# Patient Record
Sex: Male | Born: 1995
Health system: Southern US, Community
[De-identification: ages and names within clinical notes are randomized; demographics above are authoritative.]

---

## 2017-01-28 DIAGNOSIS — J309 Allergic rhinitis, unspecified: Secondary | ICD-10-CM | POA: Insufficient documentation

## 2017-06-05 DIAGNOSIS — Z111 Encounter for screening for respiratory tuberculosis: Secondary | ICD-10-CM | POA: Diagnosis not present

## 2017-06-05 DIAGNOSIS — Z23 Encounter for immunization: Secondary | ICD-10-CM | POA: Diagnosis not present

## 2017-06-05 DIAGNOSIS — Z0000 Encounter for general adult medical examination without abnormal findings: Secondary | ICD-10-CM | POA: Diagnosis not present

## 2017-06-05 DIAGNOSIS — Z6834 Body mass index (BMI) 34.0-34.9, adult: Secondary | ICD-10-CM | POA: Diagnosis not present

## 2017-08-05 DIAGNOSIS — E6609 Other obesity due to excess calories: Secondary | ICD-10-CM | POA: Diagnosis not present

## 2017-08-05 DIAGNOSIS — Z6833 Body mass index (BMI) 33.0-33.9, adult: Secondary | ICD-10-CM | POA: Diagnosis not present

## 2019-04-11 ENCOUNTER — Telehealth: Payer: Self-pay

## 2019-04-11 NOTE — Telephone Encounter (Signed)
Questions for Screening COVID-19  Symptom onset:n/a  Travel or Contacts: no  During this illness, did/does the patient experience any of the following symptoms? Fever >100.4F []  Yes [x]  No []  Unknown Subjective fever (felt feverish) []  Yes [x]  No []  Unknown Chills []  Yes [x]  No []  Unknown Muscle aches (myalgia) []  Yes [x]  No []  Unknown Runny nose (rhinorrhea) []  Yes [x]  No []  Unknown Sore throat []  Yes [x]  No []  Unknown Cough (new onset or worsening of chronic cough) []  Yes [x]  No []  Unknown Shortness of breath (dyspnea) []  Yes [x]  No []  Unknown Nausea or vomiting []  Yes [x]  No []  Unknown Headache []  Yes [x]  No []  Unknown Abdominal pain  []  Yes [x]  No []  Unknown Diarrhea (?3 loose/looser than normal stools/24hr period) []  Yes [x]  No []  Unknown Other, specify:  Patient risk factors: Smoker? []  Current []  Former []  Never If male, currently pregnant? []  Yes []  No  There are no active problems to display for this patient.   Plan:  []  High risk for COVID-19 with red flags go to ED (with CP, SOB, weak/lightheaded, or fever > 101.5). Call ahead.  []  High risk for COVID-19 but stable. Inform provider and coordinate time for WEBEX visit.   []  No red flags but URI signs or symptoms okay for WEBEX visit.  

## 2019-04-12 ENCOUNTER — Encounter: Payer: Self-pay | Admitting: Family Medicine

## 2019-04-12 ENCOUNTER — Ambulatory Visit: Payer: BC Managed Care – PPO | Admitting: Family Medicine

## 2019-04-12 VITALS — BP 130/90 | HR 88 | Temp 97.4°F | Ht 75.5 in | Wt 267.6 lb

## 2019-04-12 DIAGNOSIS — G4719 Other hypersomnia: Secondary | ICD-10-CM | POA: Diagnosis not present

## 2019-04-12 DIAGNOSIS — R5383 Other fatigue: Secondary | ICD-10-CM | POA: Diagnosis not present

## 2019-04-12 LAB — CBC WITH DIFFERENTIAL/PLATELET
Basophils Absolute: 0.1 10*3/uL (ref 0.0–0.1)
Basophils Relative: 1.4 % (ref 0.0–3.0)
Eosinophils Absolute: 0.2 10*3/uL (ref 0.0–0.7)
Eosinophils Relative: 5.4 % — ABNORMAL HIGH (ref 0.0–5.0)
HCT: 42.5 % (ref 39.0–52.0)
Hemoglobin: 14 g/dL (ref 13.0–17.0)
Lymphocytes Relative: 31.9 % (ref 12.0–46.0)
Lymphs Abs: 1.3 10*3/uL (ref 0.7–4.0)
MCHC: 32.8 g/dL (ref 30.0–36.0)
MCV: 84.3 fl (ref 78.0–100.0)
Monocytes Absolute: 0.3 10*3/uL (ref 0.1–1.0)
Monocytes Relative: 6.3 % (ref 3.0–12.0)
Neutro Abs: 2.2 10*3/uL (ref 1.4–7.7)
Neutrophils Relative %: 55 % (ref 43.0–77.0)
Platelets: 289 10*3/uL (ref 150.0–400.0)
RBC: 5.05 Mil/uL (ref 4.22–5.81)
RDW: 14 % (ref 11.5–15.5)
WBC: 4 10*3/uL (ref 4.0–10.5)

## 2019-04-12 LAB — TSH: TSH: 0.86 u[IU]/mL (ref 0.35–4.50)

## 2019-04-12 LAB — COMPREHENSIVE METABOLIC PANEL
ALT: 29 U/L (ref 0–53)
AST: 22 U/L (ref 0–37)
Albumin: 4.4 g/dL (ref 3.5–5.2)
Alkaline Phosphatase: 74 U/L (ref 39–117)
BUN: 12 mg/dL (ref 6–23)
CO2: 29 mEq/L (ref 19–32)
Calcium: 9.8 mg/dL (ref 8.4–10.5)
Chloride: 103 mEq/L (ref 96–112)
Creatinine, Ser: 1.21 mg/dL (ref 0.40–1.50)
GFR: 88.99 mL/min (ref >60.00)
Glucose, Bld: 72 mg/dL (ref 70–99)
Potassium: 3.9 mEq/L (ref 3.5–5.1)
Sodium: 139 mEq/L (ref 135–145)
Total Bilirubin: 0.3 mg/dL (ref 0.2–1.2)
Total Protein: 7 g/dL (ref 6.0–8.3)

## 2019-04-12 LAB — VITAMIN B12: Vitamin B-12: 250 pg/mL (ref 211–911)

## 2019-04-12 NOTE — Patient Instructions (Signed)
Great to meet you! We'll be in touch with lab results.  If labs are normal I think we should refer you for a sleep study.

## 2019-04-12 NOTE — Assessment & Plan Note (Signed)
Check baseline labs today including: Orders Placed This Encounter  Procedures  . Comp Met (CMET)  . CBC w/Diff  . TSH  . B12  Discussed if labs are normal I would recommend referral for sleep study.  Advised caution with driving and to only drive if well rested.
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
## 2019-04-14 NOTE — Progress Notes (Signed)
Labs are normal.  Nothing on lab testing to explain fatigue.  Would recommend proceeding with sleep evaluation.

## 2019-04-20 ENCOUNTER — Other Ambulatory Visit: Payer: Self-pay | Admitting: Family Medicine

## 2019-04-20 DIAGNOSIS — R400 Somnolence: Secondary | ICD-10-CM

## 2019-04-20 NOTE — Progress Notes (Signed)
Refer sleep

## 2019-04-27 ENCOUNTER — Ambulatory Visit: Payer: BC Managed Care – PPO | Admitting: Neurology

## 2019-04-27 ENCOUNTER — Encounter: Payer: Self-pay | Admitting: Neurology

## 2019-04-27 ENCOUNTER — Other Ambulatory Visit: Payer: Self-pay

## 2019-04-27 VITALS — BP 131/82 | HR 79 | Ht 75.5 in | Wt 267.0 lb

## 2019-04-27 DIAGNOSIS — E669 Obesity, unspecified: Secondary | ICD-10-CM

## 2019-04-27 DIAGNOSIS — Z820 Family history of epilepsy and other diseases of the nervous system: Secondary | ICD-10-CM | POA: Diagnosis not present

## 2019-04-27 DIAGNOSIS — R6889 Other general symptoms and signs: Secondary | ICD-10-CM

## 2019-04-27 DIAGNOSIS — R0683 Snoring: Secondary | ICD-10-CM

## 2019-04-27 DIAGNOSIS — G4710 Hypersomnia, unspecified: Secondary | ICD-10-CM

## 2019-04-27 DIAGNOSIS — R400 Somnolence: Secondary | ICD-10-CM

## 2019-04-27 NOTE — Patient Instructions (Signed)
Thank you for choosing [facility] for your sleep related care! It was nice to meet you today! I appreciate that you entrust me with your sleep related healthcare concerns. I hope, I was able to address at least some of your concerns today, and that I can help you feel reassured and also get better.    Here is what we discussed today and what we came up with as our plan for you:    Based on your symptoms and your exam I believe you may be at some risk for obstructive sleep apnea or OSA, and I think we should check your sleep for sleep apnea. We will look into your severe sleepiness with a nighttime sleep study, followed by a daytime nap study. In preparation for sleep study testing, you cannot be on or start any medications in the group of antidepressants, antianxiety medications, narcotic pain medications. Please do not drive when sleepy! Please keep your sleep schedule stable and do not add any additional medications or caffeine in your day to day routine, in preparation of the study.   If you have obstructive sleep apnea, we will forego daytime sleep testing and I will recommend treatment for sleep apnea with a CPAP or AutoPap machine.  Our sleep lab administrative assistant will call you to schedule your sleep study. If you don't hear back from her by about 2 weeks from now, please feel free to call her at [phone]. You can leave a message with your phone number and concerns, if you get the voicemail box. She will call back as soon as possible.
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
## 2019-05-23 ENCOUNTER — Ambulatory Visit (INDEPENDENT_AMBULATORY_CARE_PROVIDER_SITE_OTHER): Payer: BC Managed Care – PPO | Admitting: Neurology

## 2019-05-23 DIAGNOSIS — G4720 Circadian rhythm sleep disorder, unspecified type: Secondary | ICD-10-CM

## 2019-05-23 DIAGNOSIS — Z820 Family history of epilepsy and other diseases of the nervous system: Secondary | ICD-10-CM

## 2019-05-23 DIAGNOSIS — E669 Obesity, unspecified: Secondary | ICD-10-CM

## 2019-05-23 DIAGNOSIS — R0683 Snoring: Secondary | ICD-10-CM

## 2019-05-23 DIAGNOSIS — R6889 Other general symptoms and signs: Secondary | ICD-10-CM

## 2019-05-23 DIAGNOSIS — R400 Somnolence: Secondary | ICD-10-CM

## 2019-05-23 DIAGNOSIS — G4710 Hypersomnia, unspecified: Secondary | ICD-10-CM | POA: Diagnosis not present

## 2019-05-24 ENCOUNTER — Ambulatory Visit (INDEPENDENT_AMBULATORY_CARE_PROVIDER_SITE_OTHER): Payer: BC Managed Care – PPO | Admitting: Neurology

## 2019-05-24 ENCOUNTER — Other Ambulatory Visit: Payer: Self-pay

## 2019-05-24 DIAGNOSIS — R400 Somnolence: Secondary | ICD-10-CM

## 2019-05-24 DIAGNOSIS — G4710 Hypersomnia, unspecified: Secondary | ICD-10-CM

## 2019-05-24 DIAGNOSIS — Z820 Family history of epilepsy and other diseases of the nervous system: Secondary | ICD-10-CM | POA: Diagnosis not present

## 2019-05-24 DIAGNOSIS — G47419 Narcolepsy without cataplexy: Secondary | ICD-10-CM

## 2019-05-24 DIAGNOSIS — R6889 Other general symptoms and signs: Secondary | ICD-10-CM | POA: Diagnosis not present

## 2019-05-24 DIAGNOSIS — R0683 Snoring: Secondary | ICD-10-CM

## 2019-05-24 DIAGNOSIS — E669 Obesity, unspecified: Secondary | ICD-10-CM

## 2019-05-24 NOTE — Addendum Note (Signed)
Addended by: Lester Fairford A on: 05/24/2019 02:38 PM   Modules accepted: Orders

## 2019-05-24 NOTE — Addendum Note (Signed)
Addended by: Inis Sizer D on: 05/24/2019 04:01 PM   Modules accepted: Orders

## 2019-05-27 LAB — COMPREHENSIVE DRUG ANALYSIS,UR

## 2019-06-01 ENCOUNTER — Telehealth: Payer: Self-pay

## 2019-06-01 NOTE — Progress Notes (Signed)
See result note for NPSG.
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
## 2019-06-01 NOTE — Telephone Encounter (Signed)
-----   Message from Star Age, MD sent at 06/01/2019  8:40 AM EDT ----- Patient referred by Dr. Zigmund Daniel, seen by me on 04/27/19, diagnostic PSG on 9/14, MSLT on 05/24/19.   Please call and notify the patient that the recent sleep study and next day nap study did show mild, more nonspecific degree of sleepiness, not clearly supportive of narcolepsy or idiopathic hypersomnolence, although he did fall asleep in all 5 naps. I would like to go over the details of the study during a follow up appointment. Pls arrange a followup appointment at his next convenience. I would like to consider a blood test for narcolepsy marker as well.  Thanks,  Star Age, MD, PhD [facility])

## 2019-06-01 NOTE — Telephone Encounter (Signed)
I called pt. 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

## 2019-06-01 NOTE — Procedures (Signed)
PATIENT'S NAME:  Sean [name], Sean [name] DOB:      Aug 17, 1996      MR#:    [id number]     DATE OF RECORDING: 05/23/2019 REFERRING M.D.:  Luetta Nutting, DO Study Performed:   Baseline Polysomnogram HISTORY: 23 year old man with a history of seasonal allergies, childhood asthma, cigar smoking and mild obesity, who reports some snoring and significant daytime somnolence to the point where he falls asleep inadvertently and has fallen asleep at the wheel even. He has been sleepy for years. The patient endorsed the Epworth Sleepiness Scale at 14 points. The patient's weight 267 pounds with a height of 75 (inches), resulting in a BMI of 33.2 kg/m2. The patient's neck circumference measured 18.25 inches. He presents for a nocturnal PSG with next day MSLT. He had a UDS on 05/24/19, which was negative.   CURRENT MEDICATIONS: None reported.    PROCEDURE:  This is a multichannel digital polysomnogram utilizing the Somnostar 11.2 system.  Electrodes and sensors were applied and monitored per AASM Specifications.   EEG, EOG, Chin and Limb EMG, were sampled at 200 Hz.  ECG, Snore and Nasal Pressure, Thermal Airflow, Respiratory Effort, CPAP Flow and Pressure, Oximetry was sampled at 50 Hz. Digital video and audio were recorded.      BASELINE STUDY  Lights Out was at 21:58 and Lights On at 05:45.  Total recording time (TRT) was 467.5 minutes, with a total sleep time (TST) of 390 minutes.   The patient's sleep latency was 33 minutes, which is mildly delayed.  REM latency was 69 minutes, which is low normal. The sleep efficiency was 83.4 %.     SLEEP ARCHITECTURE: WASO (Wake after sleep onset) was 50 minutes with mild to moderate sleep fragmentation noted. There were 50.5 minutes in Stage N1, 150 minutes Stage N2, 112.5 minutes Stage N3 and 77 minutes in Stage REM.  The percentage of Stage N1 was 12.9%, which is increased, Stage N2 was 38.5%, Stage N3 was 28.8%, which is mildly increased, and Stage R (REM sleep) was 19.7%, which  is normal. The arousals were noted as: 57 were spontaneous, 0 were associated with PLMs, 0 were associated with respiratory events.  RESPIRATORY ANALYSIS:  There were a total of 3 respiratory events:  0 obstructive apneas, 3 central apneas and 0 mixed apneas with a total of 3 apneas and an apnea index (AI) of .5 /hour. There were 0 hypopneas with a hypopnea index of 0 /hour. The patient also had 0 respiratory event related arousals (RERAs).      The total APNEA/HYPOPNEA INDEX (AHI) was .5 /hour and the total RESPIRATORY DISTURBANCE INDEX was .5 /hour.  3 events occurred in REM sleep and 0 events in NREM. The REM AHI was 2.3 /hour, versus a non-REM AHI of 0. The patient spent 2.5 minutes of total sleep time in the supine position and 388 minutes in non-supine.. The supine AHI was 0.0 versus a non-supine AHI of 0.5.  OXYGEN SATURATION & C02:  The Wake baseline 02 saturation was 96%, with the lowest being 87%. Time spent below 89% saturation equaled 0 minutes. PERIODIC LIMB MOVEMENTS: The patient had a total of 0 Periodic Limb Movements.  The Periodic Limb Movement (PLM) index was 0 and the PLM Arousal index was 0/hour.  Audio and video analysis did not show any abnormal or unusual movements, behaviors, phonations or vocalizations. The patient took no bathroom breaks. Mild to moderate (at times) snoring was noted. The EKG was in keeping with normal sinus  rhythm (NSR).  Post-study, the patient indicated that sleep was the same as usual.   The patient had a nap study, MSLT, next day on 05/24/19 with a mean sleep latency of 13.5 minutes for 5 naps and 0 SOREMP (sleep onset REM periods) noted. He did achieve sleep in all 5 naps.  IMPRESSION:  1. Hypersomnia NOS 2. Primary Snoring 3. Dysfunctions associated with sleep stages or arousal from sleep  RECOMMENDATIONS:  1. The overnight sleep study does not demonstrate any significant obstructive or central sleep disordered breathing. No significant PLMs  were noted. No significant desaturations noted, with the exception of one brief desaturation to 87%. Mild to moderate snoring was noted.  2. The night time sleep study shows sleep fragmentation and abnormal sleep stage percentages; these are nonspecific findings and per se do not signify an intrinsic sleep disorder or a cause for the patient's sleep-related symptoms. Causes include (but are not limited to) the first night effect of the sleep study, circadian rhythm disturbances, medication effect or an underlying mood disorder or medical problem.  3. The overnight sleep study and next day MSLT/nap study demonstrate a normal percentage of REM sleep and low normal REM latency for the overnight study and a very mildly low mean sleep latency (13.5 min) and no SOREMPs (sleep onset REM periods) for the nap study. These findings are mildly abnormal and do not fulfill diagnostic criteria for narcolepsy of up to 8 min for the mean sleep latency and 2 or more SOREMPs, or idiopathic hypersomnolence. Clinical correlation is required.  4. The patient should be cautioned not to drive, work at heights, or operate dangerous or heavy equipment when tired or sleepy. Review and reiteration of good sleep hygiene measures should be pursued with any patient. 5. The patient will be seen in follow-up in the sleep clinic at [facility] for discussion of the test results, symptom, management strategies, etc. The referring provider will be notified of the test results.  I certify that I have reviewed the entire raw data recording prior to the issuance of this report in accordance with the Standards of Accreditation of the American Academy of Sleep Medicine (AASM)  Huston Foley, MD, PhD Diplomat, American Board of Neurology and Sleep Medicine (Neurology and Sleep Medicine)

## 2019-06-01 NOTE — Progress Notes (Signed)
Patient referred by Dr. Zigmund Daniel, seen by me on 04/27/19, diagnostic PSG on 9/14, MSLT on 05/24/19.   Please call and notify the patient that the recent sleep study and next day nap study did show mild, more nonspecific degree of sleepiness, not clearly supportive of narcolepsy or idiopathic hypersomnolence, although he did fall asleep in all 5 naps. I would like to go over the details of the study during a follow up appointment. Pls arrange a followup appointment at his next convenience. I would like to consider a blood test for narcolepsy marker as well.  Thanks,  Star Age, MD, PhD [facility])

## 2019-06-01 NOTE — Telephone Encounter (Signed)
I called pt and discussed his sleep study results and recommendations. Pt is agreeable to a follow up appt on 06/22/2019 at 2:00pm. Pt verbalized understanding of results. Pt had no questions at this time but was encouraged to call back if questions arise.

## 2019-06-22 ENCOUNTER — Telehealth: Payer: Self-pay

## 2019-06-22 ENCOUNTER — Ambulatory Visit: Payer: Self-pay | Admitting: Neurology

## 2019-06-22 NOTE — Telephone Encounter (Signed)
Pt did not show for their appt with Dr. Athar today.  

## 2019-06-23 ENCOUNTER — Encounter: Payer: Self-pay | Admitting: Neurology

## 2019-06-29 ENCOUNTER — Other Ambulatory Visit: Payer: Self-pay

## 2019-06-29 ENCOUNTER — Encounter: Payer: Self-pay | Admitting: Neurology

## 2019-06-29 ENCOUNTER — Ambulatory Visit (INDEPENDENT_AMBULATORY_CARE_PROVIDER_SITE_OTHER): Payer: BC Managed Care – PPO | Admitting: Neurology

## 2019-06-29 VITALS — BP 150/100 | HR 74 | Temp 98.0°F | Ht 75.5 in | Wt 278.0 lb

## 2019-06-29 DIAGNOSIS — G4710 Hypersomnia, unspecified: Secondary | ICD-10-CM | POA: Diagnosis not present

## 2019-06-29 NOTE — Patient Instructions (Signed)
Your B12 level was on the lower end of the spectrum in August, you also had a thyroid screening test done called TSH which was normal at the time.  You have no history of anemia.  I would recommend following up with primary care physician, he may want to check additional labs such as repeat B12 level, vitamin D, and testosterone level.  I do not think we need to do labs here today. If you would like to consider Provigil for as needed use or even daily use to help you stay more awake and alert during the day for nonspecific daytime sleepiness, I would be happy to prescribe it. Again, your recent sleep study at night did not show any significant sleep apnea or significant or sustained desaturations.  Your daytime sleepiness was Notable during your daytime nap study, but not in keeping with a diagnosis of narcolepsy and does not support diagnostic criteria for what we call idiopathic hypersomnolence.   For now, as discussed, I would be happy to see you back as needed. Please do not drive when you feel sleepy.

## 2019-06-29 NOTE — Progress Notes (Signed)
Subjective:    [name] ID: Sean [name] is a 23 y.o. male.  HPI     Interim history:   Sean [name] is a 23 year old right-handed gentleman with an underlying medical history of seasonal allergies, childhood asthma, cigar smoking and mild obesity, who presents for follow-up consultation of his excessive daytime somnolence after interim sleep study testing.  Sean [name] is accompanied by his wife today.  He missed an appointment on 06/22/2019.  I first met him on 04/27/2019 at Sean request of his primary care physician, at which time he reported excessive daytime somnolence since high school days.  He used to fall asleep in class.  He had fallen asleep at Sean wheel even.  He did not endorse any telltale symptoms of cataplexy or hypnagogic or hypnopompic hallucinations but reported vivid dreams.  He had rare sleep paralysis episodes.  He was advised to proceed with extended sleep testing in Sean form of nocturnal polysomnogram with next day nap testing.  He had testing with a baseline polysomnogram on 05/23/2019 followed by an MSLT on 05/24/2019. I went over his test results with them in detail today.  His baseline sleep study showed a sleep latency of 33 minutes, sleep efficiency of 83.4%, REM latency was 69 minutes.  He had an increased percentage of stage I sleep and a mildly increased percentage of slow-wave sleep, REM sleep was normal at 19.7%.  His AHI was 0.5/h which is normal, average oxygen saturation was 96%, nadir was 87%, time below 89% saturation was 0 minutes, he had no significant PLM's.  He had some snoring, it was in Sean mild to moderate range.  His next day MSLT showed a mean sleep latency of 13.5 minutes for 5 naps with 0 sleep onset REM periods.  This indicated overall mild daytime somnolence.  Findings were not supportive of narcolepsy.  Diagnostic criteria for idiopathic hypersomnolence were also not fulfilled because of his mean sleep latency of over 8 minutes.  Today, 06/29/2019: He  reports no new symptoms, feels like on a day-to-day basis he is not as sleepy but there are days where he feels very sleepy.  No particular pattern no rhyme or reason.  His wife reports that he does not even have to be on a long drive to get sleepy at Sean wheel, it can happen on a shorter drive of 10 to 30 minutes even.  He is working from home but has also had to go in for work.  He is utilizing some caffeine to help him wake up early.  He is without any new symptoms but his blood pressure is a little high today and we rechecked it.   Sean [name]'s allergies, current medications, family history, past medical history, past social history, past surgical history and problem list were reviewed and updated as appropriate.    Previously:   04/27/2019: (He) reports some snoring and significant daytime somnolence to Sean point where he falls asleep inadvertently and has fallen asleep at Sean wheel even.  He has been sleepy since high school days.  He has fallen asleep in class in Sean past.  He denies any telltale symptoms of cataplexy or hypnagogic or hypnopompic hallucinations, he has vivid dreams, he has had sleep paralysis at least once.  He has also had dreams during shorter naps.   I reviewed your office note from 04/12/2019.  His Epworth sleepiness score is 14 out of 24, fatigue severity score is 25 out of 63.  He admits that he recently dozed  off at Sean wheel.  He did not have a car accident thankfully.  He typically has a 20-minute commute to work.  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  He is married and lives with his wife who is a Marine scientist and wants him to get checked out for his sleepiness.  He is a restless sleeper at night.  He has some snoring.  Father has sleep apnea and uses a CPAP machine, no family history of narcolepsy.  They have 2 dogs in Sean household, typically in their bedroom but not on Sean bed with them, no kids, TV is on a timer at night in Sean bedroom.  He does not have night to night  nocturia or recurrent morning headaches.  He is a non-smoker of cigarettes but occasionally smokes a cigar.  He drinks alcohol occasionally, maybe once a week, caffeine in Sean form of coffee, 16 ounce per day and soda maybe 8 ounce per day.  His Past Medical History Is Significant For: History reviewed. No pertinent past medical history.  His Past Surgical History Is Significant For: History reviewed. No pertinent surgical history.  His Family History Is Significant For: Family History  Problem Relation Age of Onset  . Diabetes Father   . Diabetes Maternal Grandfather     His Social History Is Significant For: Social History   Socioeconomic History  . Marital status: Married    Spouse name: Not on file  . Number of children: Not on file  . Years of education: Not on file  . Highest education level: Not on file  Occupational History  . Not on file  Social Needs  . Financial resource strain: Not on file  . Food insecurity    Worry: Not on file    Inability: Not on file  . Transportation needs    Medical: Not on file    Non-medical: Not on file  Tobacco Use  . Smoking status: Current Some Day Smoker    Types: Cigars  . Smokeless tobacco: Never Used  Substance and Sexual Activity  . Alcohol use: Never    Frequency: Never  . Drug use: Never  . Sexual activity: Not on file  Lifestyle  . Physical activity    Days per week: Not on file    Minutes per session: Not on file  . Stress: Not on file  Relationships  . Social Herbalist on phone: Not on file    Gets together: Not on file    Attends religious service: Not on file    Active member of club or organization: Not on file    Attends meetings of clubs or organizations: Not on file    Relationship status: Not on file  Other Topics Concern  . Not on file  Social History Narrative  . Not on file    His Allergies Are:  No Known Allergies:   His Current Medications Are:  No outpatient encounter  medications on file as of 06/29/2019.   No facility-administered encounter medications on file as of 06/29/2019.   :  Review of Systems:  Out of a complete 14 point review of systems, all are reviewed and negative with Sean exception of these symptoms as listed below: Review of Systems  Neurological:       Pt presents today to discuss his sleep study results and treatment recommendations.    Objective:  Neurological Exam  Physical Exam Physical Examination:   Vitals:   06/29/19 1258  06/29/19 1259  BP: (!) 144/100 (!) 146/98  Pulse: 71 74  Temp: 98 F (36.7 C)    Repeat blood pressure testing at Sean end of Sean visit manually was 150/100.  He has no complaints of chest pain or shortness of breath or Headache.  General Examination: Sean [name] is a very pleasant 23 y.o. male in no acute distress. He appears well-developed and well-nourished and well groomed.   HEENT: Normocephalic, atraumatic, pupils are equal, round and reactive to light, Extraocular tracking is preserved, facial animation is normal, speech is clear, hearing grossly intact, airway examination revealed stable findings with mild mouth dryness and mild airway crowding, tongue protrudes centrally and palate elevates symmetrically.    Chest: Clear to auscultation without wheezing, rhonchi or crackles noted.  Heart: S1+S2+0, regular and normal without murmurs, rubs or gallops noted.   Abdomen: Soft, non-tender and non-distended with normal bowel sounds appreciated on auscultation.  Extremities: There is no pitting edema in Sean distal lower extremities bilaterally.   Skin: Warm and dry without trophic changes noted.  Musculoskeletal: exam reveals no obvious joint deformities, tenderness or joint swelling or erythema.   Neurologically:  Mental status: Sean [name] is awake, alert and oriented in all 4 spheres. His immediate and remote memory, attention, language skills and fund of knowledge are appropriate.  There is no evidence of aphasia, agnosia, apraxia or anomia. Speech is clear with normal prosody and enunciation. Thought process is linear. Mood is normal and affect is normal.  Cranial nerves II - XII are as described above under HEENT exam.  Motor exam: Normal bulk, strength and tone is noted. There is no  Tremor. Romberg is negative. Fine motor skills and coordination: grossly intact.  Cerebellar testing: No dysmetria or intention tremor. There is no truncal or gait ataxia.  Sensory exam: intact to light touch in Sean upper and lower extremities.  Gait, station and balance: He stands easily. No veering to one side is noted. No leaning to one side is noted. Posture is age-appropriate and stance is narrow based. Gait shows normal stride length and normal pace. No problems turning are noted.   Assessment and Plan:  In summary, Kynan Peasley is a very pleasant 23 year old male with an underlying medical history of seasonal allergies, childhood asthma, cigar smoking and mild obesity, who Presents for Follow-up consultation of his excessive daytime somnolence, sleepiness dates back to even high school days.  He had a interim sleep study testing in Sean form of a diagnostic polysomnogram, followed by a next day nap study.  He has no underlying obstructive sleep disordered breathing.  He had no significant PLM's during Sean nighttime sleep study.  He had some mild to moderate snoring.  Nap testing showed a mild degree of daytime somnolence with a mean sleep latency of 13.5 minutes, no sleep onset REM periods were noted.  During Sean nighttime sleep study, his REM latency was on Sean low normal side, REM percentage was low normal at 19.7%.  His sleep test results do not fulfill criteria for narcolepsy which requires less than 8 minutes for mean sleep latency and at least 2 REM onset periods during nap testing.  He does appear to have a more nonspecific degree of hypersomnolence, he may benefit from trying a  nonstimulant type wake promoting agent, such as Provigil or Nuvigil.  I talked to Sean [name] and his wife at length today.  I would not necessarily feel comfortable to prescribe a stimulant, especially since stimulants are controlled  substances and can cause side effects and also in light of his elevated blood pressure values, repeat check at Sean end of Sean visit showed a similar blood pressure, he had no symptoms today.  He is advised to follow-up with his primary care physician.  He had some blood work which I reviewed.  He did not have any B12 deficiency but B12 was on Sean lower end of Sean spectrum, TSH was normal recently. His wife is reluctant and voices concern regarding using Provigil.  He is at this juncture advised to follow-up with his PCP.  I would be happy to see him as needed.  If he changes his mind regarding trying Provigil I would be happy to prescribe a low dose and have him try it. We talked a little bit about narcolepsy HLA testing which is a marker for narcolepsy but not a definitive test.  He declines this blood work, I also explained to him that it may or may not really change our management at this time. I answered all their questions today and Sean [name] and his wife were in agreement.

## 2019-07-14 ENCOUNTER — Other Ambulatory Visit: Payer: Self-pay

## 2019-07-14 DIAGNOSIS — Z20822 Contact with and (suspected) exposure to covid-19: Secondary | ICD-10-CM

## 2019-07-15 LAB — NOVEL CORONAVIRUS, NAA: SARS-CoV-2, NAA: NOT DETECTED

## 2020-04-04 ENCOUNTER — Telehealth: Payer: Self-pay | Admitting: General Practice

## 2020-04-04 NOTE — Telephone Encounter (Signed)
Patient called after hours service to schedule TOC due to Dr. Ashley Royalty leaving [facility]. I called the patient and left message to call the office to schedule.

## 2020-05-02 ENCOUNTER — Other Ambulatory Visit: Payer: Self-pay

## 2020-05-03 ENCOUNTER — Ambulatory Visit: Payer: BC Managed Care – PPO | Admitting: Family Medicine

## 2020-05-03 ENCOUNTER — Encounter: Payer: Self-pay | Admitting: Family Medicine

## 2020-05-03 ENCOUNTER — Ambulatory Visit (INDEPENDENT_AMBULATORY_CARE_PROVIDER_SITE_OTHER): Payer: BC Managed Care – PPO

## 2020-05-03 VITALS — BP 122/78 | HR 88 | Temp 96.7°F | Ht 75.0 in | Wt 279.0 lb

## 2020-05-03 DIAGNOSIS — M25561 Pain in right knee: Secondary | ICD-10-CM

## 2020-05-03 MED ORDER — MELOXICAM 7.5 MG PO TABS: 7.5000 mg | ORAL_TABLET | Freq: Every day | ORAL | 0 refills | Status: DC

## 2020-05-03 NOTE — Progress Notes (Signed)
Established Patient Office Visit  Subjective:  Patient ID: Sean [name], male    DOB: Jan 04, 1996  Age: 24 y.o. MRN: [id number]  CC:  Chief Complaint  Patient presents with  . Knee Pain    Knee pain that come and go x 2 months C/O some swelling sometimes.     HPI [place] presents for evaluation of a 3 to 55-month history of right knee pain.  Denies injury locking or giving way.  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  He is he is on his feet throughout the day.  Athletically active on the weekends with occasional pickup basketball games.  Most of the pain is in the posterior aspect of the knee.  There seems to be swelling there.  History reviewed. No pertinent past medical history.  History reviewed. No pertinent surgical history.  Family History  Problem Relation Age of Onset  . Diabetes Father   . Diabetes Maternal Grandfather     Social History   Socioeconomic History  . Marital status: Married    Spouse name: Not on file  . Number of children: Not on file  . Years of education: Not on file  . Highest education level: Not on file  Occupational History  . Not on file  Tobacco Use  . Smoking status: Current Some Day Smoker    Types: Cigars  . Smokeless tobacco: Never Used  Substance and Sexual Activity  . Alcohol use: Never  . Drug use: Never  . Sexual activity: Not on file  Other Topics Concern  . Not on file  Social History Narrative  . Not on file   Social Determinants of Health   Financial Resource Strain:   . Difficulty of Paying Living Expenses: Not on file  Food Insecurity:   . Worried About Programme researcher, broadcasting/film/video in the Last Year: Not on file  . Ran Out of Food in the Last Year: Not on file  Transportation Needs:   . Lack of Transportation (Medical): Not on file  . Lack of Transportation (Non-Medical): Not on file  Physical Activity:   . Days of Exercise per Week: Not on file    . Minutes of Exercise per Session: Not on file  Stress:   . Feeling of Stress : Not on file  Social Connections:   . Frequency of Communication with Friends and Family: Not on file  . Frequency of Social Gatherings with Friends and Family: Not on file  . Attends Religious Services: Not on file  . Active Member of Clubs or Organizations: Not on file  . Attends Banker Meetings: Not on file  . Marital Status: Not on file  Intimate Partner Violence:   . Fear of Current or Ex-Partner: Not on file  . Emotionally Abused: Not on file  . Physically Abused: Not on file  . Sexually Abused: Not on file    No outpatient medications prior to visit.   No facility-administered medications prior to visit.    No Known Allergies  ROS Review of Systems  Constitutional: Negative.   HENT: Negative.   Respiratory: Negative.   Cardiovascular: Negative.   Gastrointestinal: Negative.   Genitourinary: Negative.   Musculoskeletal: Positive for arthralgias. Negative for gait problem.  Psychiatric/Behavioral: Negative.       Objective:    Physical Exam Vitals and nursing note reviewed.  Constitutional:      General: He is not in acute  distress.    Appearance: Normal appearance. He is not ill-appearing or toxic-appearing.  HENT:     Right Ear: External ear normal.     Left Ear: External ear normal.  Eyes:     General:        Right eye: No discharge.        Left eye: No discharge.     Conjunctiva/sclera: Conjunctivae normal.  Musculoskeletal:     Right knee: Swelling present. No effusion, erythema or bony tenderness. No tenderness. Normal patellar mobility.     Instability Tests: Anterior drawer test negative.     Left knee: No swelling, effusion, erythema or bony tenderness. No tenderness. Normal patellar mobility.     Right lower leg: No edema.     Left lower leg: No edema.       Legs:  Neurological:     Mental Status: He is alert and oriented to person, place, and  time.  Psychiatric:        Mood and Affect: Mood normal.        Behavior: Behavior normal.     BP 122/78   Pulse 88   Temp (!) 96.7 F (35.9 C) (Tympanic)   Ht 6\' 3"  (1.905 m)   Wt 279 lb (126.6 kg)   SpO2 97%   BMI 34.87 kg/m  Wt Readings from Last 3 Encounters:  05/03/20 279 lb (126.6 kg)  06/29/19 278 lb (126.1 kg)  04/27/19 267 lb (121.1 kg)     Health Maintenance Due  Topic Date Due  . Hepatitis C Screening  Never done  . HIV Screening  Never done  . INFLUENZA VACCINE  04/08/2020    There are no preventive care reminders to display for this patient.  Lab Results  Component Value Date   TSH 0.86 04/12/2019   Lab Results  Component Value Date   WBC 4.0 04/12/2019   HGB 14.0 04/12/2019   HCT 42.5 04/12/2019   MCV 84.3 04/12/2019   PLT 289.0 04/12/2019   Lab Results  Component Value Date   NA 139 04/12/2019   K 3.9 04/12/2019   CO2 29 04/12/2019   GLUCOSE 72 04/12/2019   BUN 12 04/12/2019   CREATININE 1.21 04/12/2019   BILITOT 0.3 04/12/2019   ALKPHOS 74 04/12/2019   AST 22 04/12/2019   ALT 29 04/12/2019   PROT 7.0 04/12/2019   ALBUMIN 4.4 04/12/2019   CALCIUM 9.8 04/12/2019   GFR 88.99 04/12/2019   No results found for: CHOL No results found for: HDL No results found for: LDLCALC No results found for: TRIG No results found for: CHOLHDL No results found for: 06/12/2019    Assessment & Plan:   Problem List Items Addressed This Visit      Other   Right knee pain - Primary   Relevant Medications   meloxicam (MOBIC) 7.5 MG tablet   Other Relevant Orders   DG Knee Complete 4 Views Right   Ambulatory referral to Sports Medicine      Meds ordered this encounter  Medications  . meloxicam (MOBIC) 7.5 MG tablet    Sig: Take 1 tablet (7.5 mg total) by mouth daily.    Dispense:  30 tablet    Refill:  0    Follow-up: Return if symptoms worsen or fail to improve.   Information was given on Baker's cyst.  Trial of meloxicam and low-dose for  30 days.  Follow-up with sports medicine. [id number], MD

## 2020-05-03 NOTE — Patient Instructions (Signed)
Baker Cyst ° °A Baker cyst, also called a popliteal cyst, is a growth that forms at the back of the knee. The cyst forms when the fluid-filled sac (bursa) that cushions the knee joint becomes enlarged. °What are the causes? °In most cases, a Baker cyst results from another knee problem that causes swelling inside the knee. This makes the fluid inside the knee joint (synovial fluid) flow into the bursa behind the knee, causing the bursa to enlarge. °What increases the risk? °You may be more likely to develop a Baker cyst if you already have a knee problem, such as: °· A tear in cartilage that cushions the knee joint (meniscal tear). °· A tear in the tissues that connect the bones of the knee joint (ligament tear). °· Knee swelling from osteoarthritis, rheumatoid arthritis, or gout. °What are the signs or symptoms? °The main symptom of this condition is a lump behind the knee. This may be the only symptom of the condition. The lump may be painful, especially when the knee is straightened. If the lump is painful, the pain may come and go. The knee may also be stiff. °Symptoms may quickly get more severe if the cyst breaks open (ruptures). If the cyst ruptures, you may feel the following in your knee and calf: °· Sudden or worsening pain. °· Swelling. °· Bruising. °· Redness in the calf. °A Baker cyst does not always cause symptoms. °How is this diagnosed? °This condition may be diagnosed based on your symptoms and medical history. Your health care provider will also do a physical exam. This may include: °· Feeling the cyst to check whether it is tender. °· Checking your knee for signs of another knee condition that causes swelling. °You may have imaging tests, such as: °· X-rays. °· MRI. °· Ultrasound. °How is this treated? °A Baker cyst that is not painful may go away without treatment. If the cyst gets large or painful, it will likely get better if the underlying knee problem is treated. °If needed, treatment for a  Baker cyst may include: °· Resting. °· Keeping weight off of the knee. This means not leaning on the knee to support your body weight. °· Taking NSAIDs, such as ibuprofen, to reduce pain and swelling. °· Having a procedure to drain the fluid from the cyst with a needle (aspiration). You may also get an injection of a medicine that reduces swelling (steroid). °· Having surgery. This may be needed if other treatments do not work. This usually involves correcting knee damage and removing the cyst. °Follow these instructions at home: ° °Activity °· Rest as told by your health care provider. °· Avoid activities that make pain or swelling worse. °· Return to your normal activities as told by your health care provider. Ask your health care provider what activities are safe for you. °· Do not use the injured limb to support your body weight until your health care provider says that you can. Use crutches as told by your health care provider. °General instructions °· Take over-the-counter and prescription medicines only as told by your health care provider. °· Keep all follow-up visits as told by your health care provider. This is important. °Contact a health care provider if: °· You have knee pain, stiffness, or swelling that does not get better. °Get help right away if: °· You have sudden or worsening pain and swelling in your calf area. °Summary °· A Baker cyst, also called a popliteal cyst, is a growth that forms at the   back of the knee. °· In most cases, a Baker cyst results from another knee problem that causes swelling inside the knee. °· A Baker cyst that is not painful may go away without treatment. °· If needed, treatment for a Baker cyst may include resting, keeping weight off of the knee, medicines, or draining fluid from the cyst. °· Surgery may be needed if other treatments are not effective. °This information is not intended to replace advice given to you by your health care provider. Make sure you discuss any  questions you have with your health care provider. °Document Revised: 01/07/2019 Document Reviewed: 01/07/2019 °Elsevier Patient Education © 2020 [employer] ° ° ° °

## 2020-05-29 ENCOUNTER — Ambulatory Visit: Payer: BC Managed Care – PPO | Admitting: Family Medicine

## 2020-05-29 NOTE — Progress Notes (Deleted)
   Subjective:    I'm seeing this patient as a consultation for:  Dr. Doreene Burke. Note will be routed back to referring provider/PCP.  CC: R knee pain  I, Nylan Nakatani, LAT, ATC, am serving as scribe for Dr. [name].  HPI: Pt is a 24 y/o male presenting w/ c/o R knee pain x 3-4 months w/ no known MOI.  Pt had Osgood Schlatter's as an adolescent.  He locates his pain to .  R knee swelling: yes intermittently in the R post knee R knee mechanical symptoms: no Aggravating factors: Treatments tried:  Diagnostic imaging: R knee XR- 05/03/20  Past medical history, Surgical history, Family history, Social history, Allergies, and medications have been entered into the medical record, reviewed. ***  Review of Systems: No new headache, visual changes, nausea, vomiting, diarrhea, constipation, dizziness, abdominal pain, skin rash, fevers, chills, night sweats, weight loss, swollen lymph nodes, body aches, joint swelling, muscle aches, chest pain, shortness of breath, mood changes, visual or auditory hallucinations.   Objective:   There were no vitals filed for this visit. General: Well Developed, well nourished, and in no acute distress.  Neuro/Psych: Alert and oriented x3, extra-ocular muscles intact, able to move all 4 extremities, sensation grossly intact. Skin: Warm and dry, no rashes noted.  Respiratory: Not using accessory muscles, speaking in full sentences, trachea midline.  Cardiovascular: Pulses palpable, no extremity edema. Abdomen: Does not appear distended. MSK: ***  Lab and Radiology Results No results found for this or any previous visit (from the past 72 hour(s)). No results found.  Impression and Recommendations:    Assessment and Plan: 24 y.o. male with ***.  PDMP not reviewed this encounter. No orders of the defined types were placed in this encounter.  No orders of the defined types were placed in this encounter.   Discussed warning signs or symptoms. Please see  discharge instructions. Patient expresses understanding.   ***

## 2020-06-09 ENCOUNTER — Encounter (HOSPITAL_COMMUNITY): Payer: Self-pay | Admitting: *Deleted

## 2020-06-09 ENCOUNTER — Ambulatory Visit (HOSPITAL_COMMUNITY)
Admission: EM | Admit: 2020-06-09 | Discharge: 2020-06-09 | Disposition: A | Discharge status: Home / Self Care | Payer: BC Managed Care – PPO

## 2020-06-09 ENCOUNTER — Other Ambulatory Visit: Payer: Self-pay

## 2020-06-09 DIAGNOSIS — S161XXA Strain of muscle, fascia and tendon at neck level, initial encounter: Secondary | ICD-10-CM

## 2020-06-09 DIAGNOSIS — S46911A Strain of unspecified muscle, fascia and tendon at shoulder and upper arm level, right arm, initial encounter: Secondary | ICD-10-CM | POA: Diagnosis not present

## 2020-06-09 MED ORDER — NAPROXEN 500 MG PO TABS: 500.0000 mg | ORAL_TABLET | Freq: Two times a day (BID) | ORAL | 0 refills | Status: DC

## 2020-06-09 MED ORDER — CYCLOBENZAPRINE HCL 5 MG PO TABS: 5.0000 mg | ORAL_TABLET | Freq: Two times a day (BID) | ORAL | 0 refills | Status: DC | PRN
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
## 2020-06-09 NOTE — Discharge Instructions (Signed)
Naprosyn twice daily  You may use flexeril as needed to help with pain. This is a muscle relaxer and causes sedation- please use only at bedtime or when you will be home and not have to drive/work Gentle stretching Alternate ice and heat Follow up if not improving or worsening

## 2020-08-15 ENCOUNTER — Ambulatory Visit: Payer: Self-pay

## 2020-09-20 NOTE — Progress Notes (Unsigned)
Subjective:    I'm seeing this patient as a consultation for:  Dr. Doreene Burke. Note will be routed back to referring provider/PCP.  CC: R knee pain  I, Sean [name], LAT, ATC, am serving as scribe for Dr. Clementeen [name].  HPI: Pt is a 25 y/o male presenting w/ c/o chronic R knee pain since early summer 2021 w/ no known MOI.  Pt does have a hx of Osgood Schlatter's dz.  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  He locates his pain to superior to patella and anterior and posterior aspect of knee.  R knee swelling: yes R knee mechanical symptoms: no Aggravating factors: prolonged standing, walking, activity, up stairs Treatments tried: Meloxicam, RICE  Diagnostic testing: R knee XR- 05/03/20  Past medical history, Surgical history, Family history, Social history, Allergies, and medications have been entered into the medical record, reviewed.   Review of Systems: No new headache, visual changes, nausea, vomiting, diarrhea, constipation, dizziness, abdominal pain, skin rash, fevers, chills, night sweats, weight loss, swollen lymph nodes, body aches, joint swelling, muscle aches, chest pain, shortness of breath, mood changes, visual or auditory hallucinations.   Objective:    Vitals:   09/21/20 0833  BP: 118/76  Pulse: 78   General: Well Developed, well nourished, and in no acute distress.  Neuro/Psych: Alert and oriented x3, extra-ocular muscles intact, able to move all 4 extremities, sensation grossly intact. Skin: Warm and dry, no rashes noted.  Respiratory: Not using accessory muscles, speaking in full sentences, trachea midline.  Cardiovascular: Pulses palpable, no extremity edema. Abdomen: Does not appear distended. MSK: Right knee mild effusion otherwise normal. Normal motion with crepitation. Tender palpation anterior knee overlying patella.  Palpable squeak overlying patella tendon. Intact strength with some pain to extension.   Slight laxity to LCL stress  test without pain. No laxity to anterior or posterior drawer stress test. Negative McMurray's test.  Lab and Radiology Results No results found for this or any previous visit (from the past 72 hour(s)). DG Knee AP/LAT W/Sunrise Right  Result Date: 09/21/2020 CLINICAL DATA:  Generalized right knee pain for the past 2 months. No known injury. EXAM: RIGHT KNEE 3 VIEWS COMPARISON:  05/03/2020 FINDINGS: No fracture or dislocation. Mild tricompartmental degenerative change of the knee with joint space loss, articular surface irregularity, subchondral sclerosis and osteophytosis. No evidence of chondrocalcinosis. Minimal enthesopathic change involving the superior and inferior poles of the patella. No joint effusion. Redemonstrated prominent ossicle adjacent to the tibial tuberosity measuring approximately 1.5 cm, likely the sequela of remote avulsive injury. Regional soft tissues appear normal. IMPRESSION: 1. Mild tricompartmental degenerative change of the knee. 2. Redemonstrated prominent ossicle adjacent to the tibial tuberosity, likely the sequela of remote avulsive injury. Electronically Signed   By: Simonne Come M.D.   On: 09/21/2020 09:21  I, Sean [name], personally (independently) visualized and performed the interpretation of the images attached in this note.  Diagnostic Limited MSK Ultrasound of: Right knee Quad tendon intact normal. Joint effusion present superior patellar space. Patellar tendon intact.  Hyperechoic change at distal tendon insertion consistent with old avulsion injury versus Osgood-Schlatter's in the past. Small hypoechoic change superficial to patellar tendon consistent with mild prepatellar bursitis. Lateral joint line normal. Medial joint narrowed and degenerative appearing Posterior knee no Baker's cyst. Impression: Joint effusion.  Prepatellar bursitis.  Old Technical brewer versus old avulsion injury.  Medial joint line DJD.    Impression and Recommendations:  Assessment and Plan: 25 y.o. male with right knee pain.  Multifactorial.  Patient does have joint effusion indicating that something intra-articular is irritating his knee possibly the exacerbation of DJD versus possible medial meniscus tear.  Additionally he has evidence of prepatellar bursitis.  Discussed options.  Patient elects for more conservative management strategy.  Work on Dance movement psychotherapist and weight loss.  Also recommend Voltaren gel.  If not improving would consider joint injection, physical therapy referral, or even MRI to further characterize cause of knee pain.  PDMP not reviewed this encounter. Orders Placed This Encounter  Procedures  . [place] LIMITED JOINT SPACE STRUCTURES LOW RIGHT(NO LINKED CHARGES)    Standing Status:   Future    Number of Occurrences:   1    Standing Expiration Date:   03/21/2021    Order Specific Question:   Reason for Exam (SYMPTOM  OR DIAGNOSIS REQUIRED)    Answer:   chronic right knee pain    Order Specific Question:   Preferred imaging location?    Answer:   [facility]  . DG Knee AP/LAT W/Sunrise Right    Standing Status:   Future    Number of Occurrences:   1    Standing Expiration Date:   09/21/2021    Order Specific Question:   Reason for Exam (SYMPTOM  OR DIAGNOSIS REQUIRED)    Answer:   eval knee pain    Order Specific Question:   Preferred imaging location?    Answer:   [facility]   No orders of the defined types were placed in this encounter.   Discussed warning signs or symptoms. Please see discharge instructions. Patient expresses understanding.   The above documentation has been reviewed and is accurate and complete Sean [name], M.D.

## 2020-09-21 ENCOUNTER — Ambulatory Visit: Payer: BC Managed Care – PPO | Admitting: Family Medicine

## 2020-09-21 ENCOUNTER — Ambulatory Visit (INDEPENDENT_AMBULATORY_CARE_PROVIDER_SITE_OTHER): Payer: BC Managed Care – PPO

## 2020-09-21 ENCOUNTER — Ambulatory Visit: Payer: Self-pay

## 2020-09-21 ENCOUNTER — Other Ambulatory Visit: Payer: Self-pay

## 2020-09-21 ENCOUNTER — Encounter: Payer: Self-pay | Admitting: Family Medicine

## 2020-09-21 VITALS — BP 118/76 | HR 78 | Ht 75.0 in | Wt 290.2 lb

## 2020-09-21 DIAGNOSIS — G8929 Other chronic pain: Secondary | ICD-10-CM

## 2020-09-21 DIAGNOSIS — M25561 Pain in right knee: Secondary | ICD-10-CM | POA: Diagnosis not present

## 2020-09-21 NOTE — Patient Instructions (Addendum)
Thank you for coming in today.  Please use voltaren gel up to 4x daily for pain as needed.   Please get an Xray today before you leave  Do the exercises we discussed for quad strength: View at [email or web address]-exercise-[email or web address] using code: [id number]  If not better we can do Physical therapy, injection or even MRI.

## 2020-09-21 NOTE — Progress Notes (Signed)
X-ray right knee shows a little arthritis.  Additionally it shows evidence of old injury to the front of the knee or evidence of Osgood-Schlatter in the front of the knee.

## 2020-09-24 ENCOUNTER — Ambulatory Visit: Payer: BC Managed Care – PPO | Admitting: Family Medicine

## 2020-10-01 ENCOUNTER — Encounter: Payer: Self-pay | Admitting: Family Medicine

## 2020-10-01 DIAGNOSIS — G8929 Other chronic pain: Secondary | ICD-10-CM

## 2020-10-01 DIAGNOSIS — M2391 Unspecified internal derangement of right knee: Secondary | ICD-10-CM

## 2020-10-09 NOTE — Progress Notes (Deleted)
   I, [name], LAT, ATC acting as a scribe for Sean Graham, MD.  Sean [name] is a 25 y.o. male who presents to [facility] at [facility] today for f/u chronic R knee pain ongoing since summer 2021 w/ no known MOI. Pt does have a hx of Osgood Schlatter's disease.  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 Pt was last seen by Dr. Denyse Amass on 09/21/20 and was advised to work on quad strengthening, weight loss, and try Voltaren gel. Today, pt reports //.   R knee swelling:  R knee mechanical symptoms:  Aggravating factors: prolonged standing, walking, activity, up stairs Treatments tried: HEP,  Diagnostic testing: 09/21/20 R knee XR  05/03/20 R knee XR  Pertinent review of systems: ***  Relevant historical information: ***   Exam:  There were no vitals taken for this visit. General: Well Developed, well nourished, and in no acute distress.   MSK: ***    Lab and Radiology Results No results found for this or any previous visit (from the past 72 hour(s)). No results found.     Assessment and Plan: 25 y.o. male with ***   PDMP not reviewed this encounter. No orders of the defined types were placed in this encounter.  No orders of the defined types were placed in this encounter.    Discussed warning signs or symptoms. Please see discharge instructions. Patient expresses understanding.   ***

## 2020-10-10 ENCOUNTER — Ambulatory Visit: Payer: BC Managed Care – PPO | Admitting: Family Medicine

## 2020-10-20 ENCOUNTER — Other Ambulatory Visit: Payer: Self-pay

## 2020-10-20 ENCOUNTER — Ambulatory Visit (INDEPENDENT_AMBULATORY_CARE_PROVIDER_SITE_OTHER): Payer: BC Managed Care – PPO

## 2020-10-20 DIAGNOSIS — G8929 Other chronic pain: Secondary | ICD-10-CM

## 2020-10-20 DIAGNOSIS — M2391 Unspecified internal derangement of right knee: Secondary | ICD-10-CM

## 2020-10-20 DIAGNOSIS — M25561 Pain in right knee: Secondary | ICD-10-CM | POA: Diagnosis not present

## 2020-10-22 NOTE — Progress Notes (Signed)
MRI shows a partial thickness tear of the back of the medial meniscus with a large para meniscal cyst.  There is also a small lateral meniscus tear.  No ligament tears.  Mild arthritis changes are present.  Recommend that you return to clinic to go over these results in full detail to discuss treatment plan and options.

## 2020-10-25 NOTE — Progress Notes (Unsigned)
I, [name], LAT, ATC acting as a scribe for [name], MD.  Sean [name] is a 25 y.o. male who presents to [facility] at [facility] today for f/u chronic R knee pain ongoing since early summer 2021 w/ no known MOI. Pt does have a hx of Osgood Schlatter's dz.  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  He locates his pain to superior to patella and anterior and posterior aspect of knee. Pt was last seen by Dr. Denyse Amass on 09/21/20 and was advised to work on quad strengthening, weight loss, and Voltaren gel. Today, pt reports R knee has been "locking up" on him more. Pt has been compliant w/ HEP.  R knee swelling: no R knee mechanical symptoms: yes- catching Aggravating factors: prolonged standing, walking, activity, up stairs Treatments tried: Meloxicam, RICE, Voltaren gel  Dx imaging: 10/20/20 R knee MRI  09/21/20 R knee XR  05/03/20 R knee XR    Pertinent review of systems: No fevers or chills  Relevant historical information: Otherwise healthy.  He is expecting his first child in late April.   Exam:  BP 122/76 (BP Location: Right Arm, Patient Position: Sitting, Cuff Size: Large)   Pulse 78   Ht 6\' 3"  (1.905 m)   Wt 294 lb 3.2 oz (133.4 kg)   SpO2 96%   BMI 36.77 kg/m  General: Well Developed, well nourished, and in no acute distress.   MSK: Right knee normal motion with crepitation.    Lab and Radiology Results  EXAM: MRI OF THE RIGHT KNEE WITHOUT CONTRAST  TECHNIQUE: Multiplanar, multisequence MR imaging of the knee was performed. No intravenous contrast was administered.  COMPARISON:  Radiographs 09/21/2020  FINDINGS: MENISCI  Medial meniscus: There is E partial-thickness radial tear involving the posterior horn with an associated large parameniscal cyst measuring approximately 2.9 x 2.5 cm. There is also an intraosseous component extending into the posterior tibia with surrounding marrow edema.  Lateral  meniscus: Anterior horn and midbody tears involving the inferior articular surface and free edge.  LIGAMENTS  Cruciates:  Intact  Collaterals:  Intact  CARTILAGE  Patellofemoral: Mild to moderate degenerative chondrosis mainly in the femoral trochlear region.  Medial: Moderate degenerative chondrosis with areas of moderate cartilage thinning mainly involving the femur.  Lateral: Moderate degenerative chondrosis with early joint space narrowing and spurring.  Joint:  No joint effusion.  Popliteal Fossa:  No popliteal mass or Baker's cyst.  Extensor Mechanism: The patella retinacular structures are intact and the quadriceps and patellar tendons are intact.  Bones:  No acute bony findings.  Other: Unremarkable knee musculature.  IMPRESSION: 1. Partial-thickness radial tear involving the posterior horn of the medial meniscus with an associated large parameniscal cyst. There is also an intraosseous component extending into the posterior tibia with surrounding marrow edema. 2. Small anterior horn and midbody tears of the lateral meniscus. 3. Intact ligamentous structures and no acute bony findings. 4. Tricompartmental degenerative changes.   Electronically Signed   By: 09/23/2020 M.D.   On: 10/21/2020 10:33  I, 10/23/2020, personally (independently) visualized and performed the interpretation of the images attached in this note.    Assessment and Plan: 25 y.o. male with right knee pain and mechanical symptoms secondary to posterior horn medial meniscus tear with parameniscal cyst and lateral meniscus tear.  Due to patient's mechanical symptoms now including locking he is a good candidate for potential surgical options.  Plan to  refer to orthopedic surgery to explore options and for potential surgical management.  Discussed MRI findings with patient and basics of surgery.  Recheck back with me as needed.   PDMP not reviewed this encounter. Orders  Placed This Encounter  Procedures  . Ambulatory referral to Orthopedic Surgery    Referral Priority:   Routine    Referral Type:   Surgical    Referral Reason:   Specialty Services Required    Requested Specialty:   Orthopedic Surgery    Number of Visits Requested:   1   No orders of the defined types were placed in this encounter.    Discussed warning signs or symptoms. Please see discharge instructions. Patient expresses understanding.   The above documentation has been reviewed and is accurate and complete [name], M.D.  Total encounter time 20 minutes including face-to-face time with the patient and, reviewing past medical record, and charting on the date of service.   MRI findings and treatment plan and options.

## 2020-10-26 ENCOUNTER — Other Ambulatory Visit: Payer: Self-pay

## 2020-10-26 ENCOUNTER — Ambulatory Visit: Payer: BC Managed Care – PPO | Admitting: Family Medicine

## 2020-10-26 VITALS — BP 122/76 | HR 78 | Ht 75.0 in | Wt 294.2 lb

## 2020-10-26 DIAGNOSIS — S83241A Other tear of medial meniscus, current injury, right knee, initial encounter: Secondary | ICD-10-CM | POA: Diagnosis not present

## 2020-10-26 NOTE — Patient Instructions (Addendum)
Thank you for coming in today.  Plan for orthopedic surgery referral.   You should hear form them soon.

## 2020-10-30 ENCOUNTER — Encounter: Payer: Self-pay | Admitting: Orthopaedic Surgery

## 2020-10-30 ENCOUNTER — Ambulatory Visit: Payer: BC Managed Care – PPO | Admitting: Orthopaedic Surgery

## 2020-10-30 ENCOUNTER — Other Ambulatory Visit: Payer: Self-pay

## 2020-10-30 DIAGNOSIS — S83241A Other tear of medial meniscus, current injury, right knee, initial encounter: Secondary | ICD-10-CM | POA: Diagnosis not present

## 2020-10-30 NOTE — Progress Notes (Signed)
Office Visit Note   Patient: Sean [name]           Date of Birth: Nov 05, 1995           MRN: [id number] Visit Date: 10/30/2020              Requested by: Rodolph Bong, MD [address] [place],  [place] [address] PCP: Mliss Sax, MD   Assessment & Plan: Visit Diagnoses:  1. Acute medial meniscus tear of right knee, initial encounter     Plan: I reviewed the MRI and it shows a radial tear of the posterior horn with a large para meniscal cyst.  He does have early degenerative changes that are advanced for his age.  Based on these findings I have recommended arthroscopic partial meniscectomy versus repair and decompression of the cyst if possible.  Risks benefits rehab recovery reviewed with the patient in detail.  All questions answered.  We will plan to do this as soon as possible.  Follow-Up Instructions: Return if symptoms worsen or fail to improve.   Orders:  No orders of the defined types were placed in this encounter.  No orders of the defined types were placed in this encounter.     Procedures: No procedures performed   Clinical Data: No additional findings.   Subjective: Chief Complaint  Patient presents with  . Right Knee - Pain    Ekin is a 25 year old gentleman referral from Dr. Denyse Amass for right medial meniscus tear recently found on MRI.  He denies any specific injuries just gradual worsening and frequent locking symptoms in the back of the knee.  He plays basketball and he states that Tylenol he said he is taking is not helping.  He has worsening swelling with increase activity and standing.  He works as a Education officer, museum in [place].   Review of Systems  Constitutional: Negative.   All other systems reviewed and are negative.    Objective: Vital Signs: There were no vitals taken for this visit.  Physical Exam Vitals and nursing note reviewed.  Constitutional:      Appearance: He is well-developed and well-nourished.   HENT:     Head: Normocephalic and atraumatic.  Eyes:     Pupils: Pupils are equal, round, and reactive to light.  Pulmonary:     Effort: Pulmonary effort is normal.  Abdominal:     Palpations: Abdomen is soft.  Musculoskeletal:        General: Normal range of motion.     Cervical back: Neck supple.  Skin:    General: Skin is warm.  Neurological:     Mental Status: He is alert and oriented to person, place, and time.  Psychiatric:        Mood and Affect: Mood and affect normal.        Behavior: Behavior normal.        Thought Content: Thought content normal.        Judgment: Judgment normal.     Ortho Exam Right knee shows a small effusion.  Range of motion is slightly limited with flexion secondary to pain and discomfort.  Negative McMurray.  No joint line tenderness.  Pain is localized to the posterior aspect of the knee more so on the medial side.  No neurovascular compromise.  Collaterals and cruciates are stable. Specialty Comments:  No specialty comments available.  Imaging: No results found.   PMFS History: Patient Active Problem List   Diagnosis Date Noted  .  Acute medial meniscus tear of right knee 10/30/2020  . Right knee pain 05/03/2020  . Other fatigue 04/12/2019   History reviewed. No pertinent past medical history.  Family History  Problem Relation Age of Onset  . Diabetes Father   . Diabetes Maternal Grandfather   . Healthy Mother     History reviewed. No pertinent surgical history. Social History   Occupational History  . Not on file  Tobacco Use  . Smoking status: Former Smoker    Types: Cigars  . Smokeless tobacco: Never Used  Vaping Use  . Vaping Use: Never used  Substance and Sexual Activity  . Alcohol use: Not Currently  . Drug use: Not Currently  . Sexual activity: Yes

## 2020-10-31 ENCOUNTER — Other Ambulatory Visit: Payer: Self-pay

## 2020-10-31 ENCOUNTER — Encounter (HOSPITAL_BASED_OUTPATIENT_CLINIC_OR_DEPARTMENT_OTHER): Payer: Self-pay | Admitting: Orthopaedic Surgery

## 2020-11-05 ENCOUNTER — Other Ambulatory Visit (HOSPITAL_COMMUNITY): Payer: BC Managed Care – PPO

## 2020-11-06 ENCOUNTER — Other Ambulatory Visit: Payer: Self-pay

## 2020-11-06 ENCOUNTER — Other Ambulatory Visit (HOSPITAL_COMMUNITY)
Admission: RE | Admit: 2020-11-06 | Discharge: 2020-11-06 | Disposition: A | Discharge status: Home / Self Care | Payer: BC Managed Care – PPO | Source: Ambulatory Visit | Attending: Orthopaedic Surgery | Admitting: Orthopaedic Surgery

## 2020-11-06 DIAGNOSIS — S83281A Other tear of lateral meniscus, current injury, right knee, initial encounter: Secondary | ICD-10-CM | POA: Diagnosis not present

## 2020-11-06 DIAGNOSIS — M659 Synovitis and tenosynovitis, unspecified: Secondary | ICD-10-CM | POA: Diagnosis not present

## 2020-11-06 DIAGNOSIS — Z01812 Encounter for preprocedural laboratory examination: Secondary | ICD-10-CM | POA: Insufficient documentation

## 2020-11-06 DIAGNOSIS — Z20822 Contact with and (suspected) exposure to covid-19: Secondary | ICD-10-CM | POA: Insufficient documentation

## 2020-11-06 DIAGNOSIS — S83241A Other tear of medial meniscus, current injury, right knee, initial encounter: Secondary | ICD-10-CM | POA: Diagnosis present

## 2020-11-06 DIAGNOSIS — X58XXXA Exposure to other specified factors, initial encounter: Secondary | ICD-10-CM | POA: Diagnosis not present

## 2020-11-06 DIAGNOSIS — Z87891 Personal history of nicotine dependence: Secondary | ICD-10-CM | POA: Diagnosis not present

## 2020-11-06 DIAGNOSIS — M94261 Chondromalacia, right knee: Secondary | ICD-10-CM | POA: Diagnosis not present

## 2020-11-06 LAB — SARS CORONAVIRUS 2 (TAT 6-24 HRS): SARS Coronavirus 2: NEGATIVE

## 2020-11-07 ENCOUNTER — Ambulatory Visit (HOSPITAL_BASED_OUTPATIENT_CLINIC_OR_DEPARTMENT_OTHER): Payer: BC Managed Care – PPO | Admitting: Anesthesiology

## 2020-11-07 ENCOUNTER — Other Ambulatory Visit: Payer: Self-pay

## 2020-11-07 ENCOUNTER — Encounter (HOSPITAL_BASED_OUTPATIENT_CLINIC_OR_DEPARTMENT_OTHER)
Admission: RE | Disposition: A | Discharge status: Home / Self Care | Payer: Self-pay | Source: Home / Self Care | Attending: Orthopaedic Surgery

## 2020-11-07 ENCOUNTER — Encounter (HOSPITAL_BASED_OUTPATIENT_CLINIC_OR_DEPARTMENT_OTHER): Payer: Self-pay | Admitting: Orthopaedic Surgery

## 2020-11-07 ENCOUNTER — Ambulatory Visit (HOSPITAL_BASED_OUTPATIENT_CLINIC_OR_DEPARTMENT_OTHER)
Admission: RE | Admit: 2020-11-07 | Discharge: 2020-11-07 | Disposition: A | Discharge status: Home / Self Care | Payer: BC Managed Care – PPO | Attending: Orthopaedic Surgery | Admitting: Orthopaedic Surgery

## 2020-11-07 ENCOUNTER — Encounter: Payer: Self-pay | Admitting: Orthopaedic Surgery

## 2020-11-07 DIAGNOSIS — S83281A Other tear of lateral meniscus, current injury, right knee, initial encounter: Secondary | ICD-10-CM

## 2020-11-07 DIAGNOSIS — S83241A Other tear of medial meniscus, current injury, right knee, initial encounter: Secondary | ICD-10-CM | POA: Diagnosis not present

## 2020-11-07 DIAGNOSIS — Z87891 Personal history of nicotine dependence: Secondary | ICD-10-CM | POA: Insufficient documentation

## 2020-11-07 DIAGNOSIS — M659 Synovitis and tenosynovitis, unspecified: Secondary | ICD-10-CM | POA: Insufficient documentation

## 2020-11-07 DIAGNOSIS — Z20822 Contact with and (suspected) exposure to covid-19: Secondary | ICD-10-CM | POA: Insufficient documentation

## 2020-11-07 DIAGNOSIS — X58XXXA Exposure to other specified factors, initial encounter: Secondary | ICD-10-CM | POA: Insufficient documentation

## 2020-11-07 DIAGNOSIS — M94261 Chondromalacia, right knee: Secondary | ICD-10-CM

## 2020-11-07 HISTORY — PX: KNEE ARTHROSCOPY WITH DRILLING/MICROFRACTURE: SHX6425

## 2020-11-07 HISTORY — PX: KNEE ARTHROSCOPY WITH MEDIAL MENISECTOMY: SHX5651

## 2020-11-07 SURGERY — ARTHROSCOPY, KNEE, WITH MEDIAL MENISCECTOMY
Anesthesia: General | Site: Knee | Laterality: Right

## 2020-11-07 MED ORDER — MIDAZOLAM HCL 5 MG/5ML IJ SOLN
INTRAMUSCULAR | Status: DC | PRN
  Administered 2020-11-07: 2 mg via INTRAVENOUS

## 2020-11-07 MED ORDER — HYDROCODONE-ACETAMINOPHEN 5-325 MG PO TABS: 1.0000 | ORAL_TABLET | Freq: Four times a day (QID) | ORAL | 0 refills | Status: DC | PRN

## 2020-11-07 MED ORDER — FENTANYL CITRATE (PF) 100 MCG/2ML IJ SOLN
INTRAMUSCULAR | Status: DC | PRN
  Administered 2020-11-07: 100 ug via INTRAVENOUS

## 2020-11-07 MED ORDER — SODIUM CHLORIDE 0.9 % IR SOLN
Status: DC | PRN
  Administered 2020-11-07: 4000 mL

## 2020-11-07 MED ORDER — CEFAZOLIN SODIUM-DEXTROSE 1-4 GM/50ML-% IV SOLN
INTRAVENOUS | Status: AC
  Filled 2020-11-07: qty 50

## 2020-11-07 MED ORDER — FENTANYL CITRATE (PF) 100 MCG/2ML IJ SOLN
25.0000 ug | INTRAMUSCULAR | Status: DC | PRN
  Administered 2020-11-07: 50 ug via INTRAVENOUS

## 2020-11-07 MED ORDER — ACETAMINOPHEN 500 MG PO TABS
ORAL_TABLET | ORAL | Status: AC
  Filled 2020-11-07: qty 2

## 2020-11-07 MED ORDER — BUPIVACAINE-EPINEPHRINE (PF) 0.5% -1:200000 IJ SOLN: INTRAMUSCULAR | Status: DC | PRN

## 2020-11-07 MED ORDER — FENTANYL CITRATE (PF) 100 MCG/2ML IJ SOLN
INTRAMUSCULAR | Status: AC
  Filled 2020-11-07: qty 2

## 2020-11-07 MED ORDER — PROPOFOL 10 MG/ML IV BOLUS
INTRAVENOUS | Status: AC
  Filled 2020-11-07: qty 20

## 2020-11-07 MED ORDER — ONDANSETRON HCL 4 MG/2ML IJ SOLN
INTRAMUSCULAR | Status: DC | PRN
  Administered 2020-11-07: 4 mg via INTRAVENOUS

## 2020-11-07 MED ORDER — CEFAZOLIN SODIUM-DEXTROSE 2-4 GM/100ML-% IV SOLN
INTRAVENOUS | Status: AC
  Filled 2020-11-07: qty 100

## 2020-11-07 MED ORDER — CELECOXIB 200 MG PO CAPS
200.0000 mg | ORAL_CAPSULE | Freq: Once | ORAL | Status: AC
  Administered 2020-11-07: 200 mg via ORAL

## 2020-11-07 MED ORDER — BUPIVACAINE HCL (PF) 0.25 % IJ SOLN
INTRAMUSCULAR | Status: AC
  Filled 2020-11-07: qty 30

## 2020-11-07 MED ORDER — PROPOFOL 10 MG/ML IV BOLUS
INTRAVENOUS | Status: DC | PRN
  Administered 2020-11-07: 300 mg via INTRAVENOUS

## 2020-11-07 MED ORDER — CELECOXIB 200 MG PO CAPS
ORAL_CAPSULE | ORAL | Status: AC
  Filled 2020-11-07: qty 1

## 2020-11-07 MED ORDER — MIDAZOLAM HCL 2 MG/2ML IJ SOLN
INTRAMUSCULAR | Status: AC
  Filled 2020-11-07: qty 2

## 2020-11-07 MED ORDER — DEXTROSE 5 % IV SOLN
3.0000 g | INTRAVENOUS | Status: AC
  Administered 2020-11-07: 3 g via INTRAVENOUS
  Filled 2020-11-07: qty 3000

## 2020-11-07 MED ORDER — AMISULPRIDE (ANTIEMETIC) 5 MG/2ML IV SOLN: 10.0000 mg | Freq: Once | INTRAVENOUS | Status: DC | PRN

## 2020-11-07 MED ORDER — ACETAMINOPHEN 500 MG PO TABS
1000.0000 mg | ORAL_TABLET | Freq: Once | ORAL | Status: AC
  Administered 2020-11-07: 1000 mg via ORAL

## 2020-11-07 MED ORDER — LACTATED RINGERS IV SOLN: INTRAVENOUS | Status: DC

## 2020-11-07 MED ORDER — BUPIVACAINE HCL (PF) 0.25 % IJ SOLN
INTRAMUSCULAR | Status: DC | PRN
  Administered 2020-11-07: 20 mL

## 2020-11-07 MED ORDER — DEXAMETHASONE SODIUM PHOSPHATE 10 MG/ML IJ SOLN
INTRAMUSCULAR | Status: DC | PRN
  Administered 2020-11-07: 5 mg via INTRAVENOUS

## 2020-11-07 MED ORDER — ONDANSETRON HCL 4 MG/2ML IJ SOLN
INTRAMUSCULAR | Status: AC
  Filled 2020-11-07: qty 2

## 2020-11-07 SURGICAL SUPPLY — 36 items
BANDAGE ESMARK 6X9 LF (GAUZE/BANDAGES/DRESSINGS) IMPLANT
BLADE EXCALIBUR 4.0X13 (MISCELLANEOUS) ×2 IMPLANT
BLADE SHAVER TORPEDO 4X13 (MISCELLANEOUS) IMPLANT
BNDG ELASTIC 6X5.8 VLCR STR LF (GAUZE/BANDAGES/DRESSINGS) ×4 IMPLANT
BNDG ESMARK 6X9 LF (GAUZE/BANDAGES/DRESSINGS)
COOLER ICEMAN CLASSIC (MISCELLANEOUS) ×4 IMPLANT
COVER WAND RF STERILE (DRAPES) IMPLANT
CUFF TOURN SGL QUICK 34 (TOURNIQUET CUFF) ×1
CUFF TRNQT CYL 34X4.125X (TOURNIQUET CUFF) ×1 IMPLANT
CUTTER BONE 4.0MM X 13CM (MISCELLANEOUS) ×2 IMPLANT
DRAPE ARTHROSCOPY W/POUCH 90 (DRAPES) ×2 IMPLANT
DRAPE IMP U-DRAPE 54X76 (DRAPES) ×2 IMPLANT
DRAPE U-SHAPE 47X51 STRL (DRAPES) ×2 IMPLANT
DURAPREP 26ML APPLICATOR (WOUND CARE) ×2 IMPLANT
GAUZE SPONGE 4X4 12PLY STRL (GAUZE/BANDAGES/DRESSINGS) ×2 IMPLANT
GAUZE XEROFORM 1X8 LF (GAUZE/BANDAGES/DRESSINGS) ×2 IMPLANT
GLOVE SURG LTX SZ7 (GLOVE) ×2 IMPLANT
GLOVE SURG NEOP MICRO LF SZ7.5 (GLOVE) ×2 IMPLANT
GLOVE SURG SYN 7.5  E (GLOVE) ×1
GLOVE SURG SYN 7.5 E (GLOVE) ×1 IMPLANT
GLOVE SURG UNDER POLY LF SZ7 (GLOVE) ×2 IMPLANT
GOWN STRL REIN XL XLG (GOWN DISPOSABLE) ×2 IMPLANT
GOWN STRL REUS W/ TWL LRG LVL3 (GOWN DISPOSABLE) ×1 IMPLANT
GOWN STRL REUS W/ TWL XL LVL3 (GOWN DISPOSABLE) ×1 IMPLANT
GOWN STRL REUS W/TWL LRG LVL3 (GOWN DISPOSABLE) ×1
GOWN STRL REUS W/TWL XL LVL3 (GOWN DISPOSABLE) ×1
MANIFOLD NEPTUNE II (INSTRUMENTS) ×2 IMPLANT
PACK ARTHROSCOPY DSU (CUSTOM PROCEDURE TRAY) ×2 IMPLANT
PACK BASIN DAY SURGERY FS (CUSTOM PROCEDURE TRAY) ×2 IMPLANT
PAD COLD SHLDR UNI WRAP-ON (PAD) ×2
PAD COLD SHLDR WRAP-ON (PAD) ×2 IMPLANT
PAD COLD UNI WRAP-ON (PAD) ×1 IMPLANT
SHEET MEDIUM DRAPE 40X70 STRL (DRAPES) ×2 IMPLANT
SUT ETHILON 3 0 PS 1 (SUTURE) ×2 IMPLANT
TOWEL GREEN STERILE FF (TOWEL DISPOSABLE) ×2 IMPLANT
TUBING ARTHROSCOPY IRRIG 16FT (MISCELLANEOUS) ×2 IMPLANT

## 2020-11-07 NOTE — Op Note (Signed)
   Surgery Date: 11/07/2020  PREOPERATIVE DIAGNOSES:  1. Right knee medial and lateral meniscus tear 2. Right knee synovitis  POSTOPERATIVE DIAGNOSES:  same  PROCEDURES PERFORMED:  1. Right knee arthroscopy with major synovectomy 2. Right knee arthroscopy with arthroscopic partial medial and lateral meniscectomy 3. Right knee arthroscopy with arthroscopic chondroplasty and microfracture medial femoral condyle and medial tibial plateau.  SURGEON: N. Glee Arvin, M.D.  ASSIST: Starlyn Skeans [place], [place]; necessary for the timely completion of procedure and due to complexity of procedure.  ANESTHESIA:  general  FLUIDS: Per anesthesia record.   ESTIMATED BLOOD LOSS: minimal  DESCRIPTION OF PROCEDURE: Mr. Sean [name] is a 25 y.o.-year-old male with above mentioned conditions. Full discussion held regarding risks benefits alternatives and complications related surgical intervention. Conservative care options reviewed. All questions answered.  The patient was identified in the preoperative holding area and the operative extremity was marked. The patient was brought to the operating room and transferred to operating table in a supine position. Satisfactory general anesthesia was induced by anesthesiology.    Standard anterolateral, anteromedial arthroscopy portals were obtained. The anteromedial portal was obtained with a spinal needle for localization under direct visualization with subsequent diagnostic findings.   Incisions were made for arthroscopic portals.  Diagnostic knee arthroscopy was first performed.  This revealed mild synovitis in the medial lateral gutters as well as the suprapatellar pouch.  Limited synovectomy was performed for visualization.  The patellofemoral compartment showed no significant changes.  We then placed a gentle valgus force on the knee in order to address the medial compartment.  The medial meniscus was first evaluated and showed a small top surface tear of the  posterior horn without any evidence of the tear going all the way through to the inferior surface.  Gentle debridement was performed with a full-radius shaver.  We did find a focal area of the medial femoral condyle of grade IV chondromalacia with unstable flaps of cartilage.  Chondroplasty was first performed using a full-radius shaver back to a stable border.  Microfracture was then performed on this defect.  There was good bleeding bone once the pressure was let off.  Cruciates were unremarkable.  Lateral compartment showed radial tear of the anterior horn.  Partial lateral meniscectomy was performed back to stable rim.  Gutters were checked for loose bodies.  Excess fluid was removed from the knee joint.  Incisions were closed with interrupted nylon sutures.  Sterile dressings were applied.  Patient tolerated procedure well had no immediate complications.  Suprapatellar pouch and gutters: moderate synovitis or debris. Patella chondral surface: Grade 0 Trochlear chondral surface: Grade 0 Patellofemoral tracking: normal  Medial meniscus: small top surface tear.  Medial femoral condyle weight bearing surface: Focal grade 4 area 1 x 1.5 cm Medial tibial plateau: Grade 0 Anterior cruciate ligament:stable Posterior cruciate ligament:stable Lateral meniscus: anterior radial tear.   Lateral femoral condyle weight bearing surface: Grade 0 Lateral tibial plateau: Grade 0  DISPOSITION: The patient was awakened from general anesthetic, extubated, taken to the recovery room in medically stable condition, no apparent complications. The patient may be weightbearing as tolerated to the operative lower extremity.  Range of motion of right knee as tolerated.  Mayra Reel, MD [facility] 12:52 PM

## 2020-11-07 NOTE — Transfer of Care (Signed)
Immediate Anesthesia Transfer of Care Note  Patient: Sean [name]  Procedure(s) Performed: RIGHT KNEE ARTHROSCOPY WITH PARTIAL MEDIAL MENISCECTOMY, PARTIAL LATERAL MENISCECTOMY (Right Knee) ANKLE ARTHROSCOPY WITH DRILLING/MICROFRACTURE (Right Knee)  Patient Location: PACU  Anesthesia Type:General  Level of Consciousness: drowsy, patient cooperative and responds to stimulation  Airway & Oxygen Therapy: Patient Spontanous Breathing and Patient connected to face mask oxygen  Post-op Assessment: Report given to RN and Post -op Vital signs reviewed and stable  Post vital signs: Reviewed and stable  Last Vitals:  Vitals Value Taken Time  BP    Temp    Pulse 91 11/07/20 1304  Resp 16 11/07/20 1304  SpO2 100 % 11/07/20 1304  Vitals shown include unvalidated device data.  Last Pain:  Vitals:   11/07/20 1054  PainSc: 0-No pain         Complications: No complications documented.

## 2020-11-07 NOTE — Anesthesia Procedure Notes (Signed)
Procedure Name: LMA Insertion Date/Time: 11/07/2020 12:12 PM Performed by: Thornell Mule, CRNA Pre-anesthesia Checklist: Patient identified, Emergency Drugs available, Suction available and Patient being monitored Patient Re-evaluated:Patient Re-evaluated prior to induction Oxygen Delivery Method: Circle system utilized Preoxygenation: Pre-oxygenation with 100% oxygen Induction Type: IV induction LMA: LMA inserted LMA Size: 5.0 Number of attempts: 1 Placement Confirmation: positive ETCO2 Tube secured with: Tape Dental Injury: Teeth and Oropharynx as per pre-operative assessment

## 2020-11-07 NOTE — Anesthesia Preprocedure Evaluation (Addendum)
Anesthesia Evaluation  Patient identified by MRN, date of birth, ID band Patient awake    Reviewed: Allergy & Precautions, NPO status , Patient's Chart, lab work & pertinent test results  Airway Mallampati: II  TM Distance: >3 FB     Dental  (+) Dental Advisory Given   Pulmonary former smoker,    breath sounds clear to auscultation       Cardiovascular negative cardio ROS   Rhythm:Regular Rate:Normal     Neuro/Psych negative neurological ROS     GI/Hepatic negative GI ROS, Neg liver ROS,   Endo/Other  negative endocrine ROS  Renal/GU negative Renal ROS     Musculoskeletal   Abdominal   Peds  Hematology negative hematology ROS (+)   Anesthesia Other Findings   Reproductive/Obstetrics                             Anesthesia Physical Anesthesia Plan  ASA: II  Anesthesia Plan: General   Post-op Pain Management:    Induction: Intravenous  PONV Risk Score and Plan: 2 and Ondansetron, Dexamethasone and Treatment may vary due to age or medical condition  Airway Management Planned: LMA  Additional Equipment:   Intra-op Plan:   Post-operative Plan: Extubation in OR  Informed Consent: I have reviewed the patients History and Physical, chart, labs and discussed the procedure including the risks, benefits and alternatives for the proposed anesthesia with the patient or authorized representative who has indicated his/her understanding and acceptance.     Dental advisory given  Plan Discussed with: CRNA  Anesthesia Plan Comments:         Anesthesia Quick Evaluation

## 2020-11-07 NOTE — Anesthesia Postprocedure Evaluation (Signed)
Anesthesia Post Note  Patient: Sean [name]  Procedure(s) Performed: RIGHT KNEE ARTHROSCOPY WITH PARTIAL MEDIAL MENISCECTOMY, PARTIAL LATERAL MENISCECTOMY (Right Knee) KNEE ARTHROSCOPY WITH DRILLING/MICROFRACTURE (Right Knee)     Patient location during evaluation: PACU Anesthesia Type: General Level of consciousness: awake and alert Pain management: pain level controlled Vital Signs Assessment: post-procedure vital signs reviewed and stable Respiratory status: spontaneous breathing, nonlabored ventilation, respiratory function stable and patient connected to nasal cannula oxygen Cardiovascular status: blood pressure returned to baseline and stable Postop Assessment: no apparent nausea or vomiting Anesthetic complications: no   No complications documented.  Last Vitals:  Vitals:   11/07/20 1330 11/07/20 1352  BP: 119/83 111/84  Pulse: 84   Resp: 17 16  Temp:  36.7 C  SpO2: 100% 100%    Last Pain:  Vitals:   11/07/20 1352  PainSc: 0-No pain                 Kennieth Rad

## 2020-11-07 NOTE — Discharge Instructions (Signed)

## 2020-11-07 NOTE — Anesthesia Procedure Notes (Deleted)
Anesthesia Regional Block: Popliteal block   Pre-Anesthetic Checklist: ,, timeout performed, Correct Patient, Correct Site, Correct Laterality, Correct Procedure, Correct Position, site marked, Risks and benefits discussed,  Surgical consent,  Pre-op evaluation,  At surgeon's request and post-op pain management  Laterality: Left  Prep: chloraprep       Needles:  Injection technique: Single-shot  Needle Type: Echogenic Needle     Needle Length: 9cm  Needle Gauge: 21     Additional Needles:   Procedures:,,,, ultrasound used (permanent image in chart),,,,  Narrative:  Start time: 11/07/2020 11:00 AM End time: 11/07/2020 11:05 AM Injection made incrementally with aspirations every 5 mL.  Performed by: Personally  Anesthesiologist: Makaleigh Reinard, MD        

## 2020-11-07 NOTE — H&P (Signed)
    PREOPERATIVE H&P  Chief Complaint: right knee medial meniscal tear  HPI: Sean [name] is a 25 y.o. male who presents for surgical treatment of right knee medial meniscal tear.  He denies any changes in medical history.  History reviewed. No pertinent past medical history. History reviewed. No pertinent surgical history. Social History   Socioeconomic History  . Marital status: Married    Spouse name: Not on file  . Number of children: Not on file  . Years of education: Not on file  . Highest education level: Not on file  Occupational History  . Not on file  Tobacco Use  . Smoking status: Former Smoker    Types: Cigars  . Smokeless tobacco: Never Used  Vaping Use  . Vaping Use: Never used  Substance and Sexual Activity  . Alcohol use: Not Currently  . Drug use: Not Currently  . Sexual activity: Yes  Other Topics Concern  . Not on file  Social History Narrative  . Not on file   Social Determinants of Health   Financial Resource Strain: Not on file  Food Insecurity: Not on file  Transportation Needs: Not on file  Physical Activity: Not on file  Stress: Not on file  Social Connections: Not on file   Family History  Problem Relation Age of Onset  . Diabetes Father   . Diabetes Maternal Grandfather   . Healthy Mother    No Known Allergies Prior to Admission medications   Medication Sig Start Date End Date Taking? Authorizing Provider  Loratadine (CLARITIN PO) Take by mouth.    [provider]     Positive ROS: All other systems have been reviewed and were otherwise negative with the exception of those mentioned in the HPI and as above.  Physical Exam: General: Alert, no acute distress Cardiovascular: No pedal edema Respiratory: No cyanosis, no use of accessory musculature GI: abdomen soft Skin: No lesions in the area of chief complaint Neurologic: Sensation intact distally Psychiatric: Patient is competent for consent with normal mood and  affect Lymphatic: no lymphedema  MUSCULOSKELETAL: exam stable  Assessment: right knee medial meniscal tear  Plan: Plan for Procedure(s): RIGHT KNEE ARTHROSCOPY WITH PARTIAL MEDIAL MENISCECTOMY VS. MEDIAL MENISCAL REPAIR, POSSIBLE PARTIAL LATERAL MENISCECTOMY  The risks benefits and alternatives were discussed with the patient including but not limited to the risks of nonoperative treatment, versus surgical intervention including infection, bleeding, nerve injury,  blood clots, cardiopulmonary complications, morbidity, mortality, among others, and they were willing to proceed.   Preoperative templating of the joint replacement has been completed, documented, and submitted to the Operating Room personnel in order to optimize intra-operative equipment management.   Glee Arvin, MD 11/07/2020 10:56 AM

## 2020-11-08 ENCOUNTER — Encounter (HOSPITAL_BASED_OUTPATIENT_CLINIC_OR_DEPARTMENT_OTHER): Payer: Self-pay | Admitting: Orthopaedic Surgery

## 2020-11-09 ENCOUNTER — Telehealth: Payer: Self-pay | Admitting: Orthopaedic Surgery

## 2020-11-09 NOTE — Telephone Encounter (Signed)
Holding for Xu/Liz

## 2020-11-09 NOTE — Telephone Encounter (Signed)
Patient called requesting a handicap placard. Patient asked to be called when ready for pick up. Patient phone number is [phone].

## 2020-11-12 NOTE — Telephone Encounter (Signed)
Yes 6 months

## 2020-11-13 NOTE — Telephone Encounter (Signed)
Handicap form ready for pick up at the front desk. Called patient. No answer LMOM.

## 2020-11-14 ENCOUNTER — Encounter: Payer: Self-pay | Admitting: Orthopaedic Surgery

## 2020-11-14 ENCOUNTER — Ambulatory Visit (INDEPENDENT_AMBULATORY_CARE_PROVIDER_SITE_OTHER): Payer: BC Managed Care – PPO | Admitting: Physician Assistant

## 2020-11-14 ENCOUNTER — Other Ambulatory Visit: Payer: Self-pay

## 2020-11-14 DIAGNOSIS — Z9889 Other specified postprocedural states: Secondary | ICD-10-CM

## 2020-11-14 NOTE — Progress Notes (Signed)
   Post-Op Visit Note   Patient: Sean [name]           Date of Birth: April 27, 1996           MRN: [id number] Visit Date: 11/14/2020 PCP: Mliss Sax, MD   Assessment & Plan:  Chief Complaint:  Chief Complaint  Patient presents with  . Right Knee - Pain   Visit Diagnoses:  1. S/P arthroscopy of right knee     Plan: Patient is a pleasant 25 year old gentleman who comes in today 1 week out right knee arthroscopic debridement medial and lateral meniscus and microfracture medial femoral condyle.  He has been doing well.  He is ambulating with 1 crutch.  He has been icing and elevating.  Is no longer taking narcotic pain medication.  Examination of his right knee reveals well-healing surgical portals with nylon sutures in place.  No evidence of infection or cellulitis.  Calf is soft nontender.  Today, sutures were removed and Steri-Strips applied.  Intraoperative pictures reviewed.  I provided him with a home exercise program as well as an outpatient physical therapy prescription.  Return to work tomorrow with no standing duties.  Follow-up with [place] in 5 weeks time for recheck.  Call with concerns or questions.  Follow-Up Instructions: Return in about 5 weeks (around 12/19/2020).   Orders:  No orders of the defined types were placed in this encounter.  No orders of the defined types were placed in this encounter.   Imaging: No new imaging  PMFS History: Patient Active Problem List   Diagnosis Date Noted  . Acute tear lateral meniscus, right, initial encounter 11/07/2020  . Chondromalacia, right knee 11/07/2020  . Acute medial meniscus tear of right knee 10/30/2020  . Right knee pain 05/03/2020  . Other fatigue 04/12/2019   History reviewed. No pertinent past medical history.  Family History  Problem Relation Age of Onset  . Diabetes Father   . Diabetes Maternal Grandfather   . Healthy Mother     Past Surgical History:  Procedure Laterality Date  . KNEE  ARTHROSCOPY WITH DRILLING/MICROFRACTURE Right 11/07/2020   Procedure: KNEE ARTHROSCOPY WITH DRILLING/MICROFRACTURE;  Surgeon: Tarry Kos, MD;  Location: [facility];  Service: Orthopedics;  Laterality: Right;  . KNEE ARTHROSCOPY WITH MEDIAL MENISECTOMY Right 11/07/2020   Procedure: RIGHT KNEE ARTHROSCOPY WITH PARTIAL MEDIAL MENISCECTOMY, PARTIAL LATERAL MENISCECTOMY;  Surgeon: Tarry Kos, MD;  Location: [facility];  Service: Orthopedics;  Laterality: Right;   Social History   Occupational History  . Not on file  Tobacco Use  . Smoking status: Former Smoker    Types: Cigars  . Smokeless tobacco: Never Used  Vaping Use  . Vaping Use: Never used  Substance and Sexual Activity  . Alcohol use: Not Currently  . Drug use: Not Currently  . Sexual activity: Yes

## 2021-02-13 IMAGING — MR MR KNEE*R* W/O CM
7 series · 40 of 40 positions shown · non-contrast
Comparison: Radiographs 09/21/2020

CLINICAL DATA: Right knee pain posteriorly for 3 months. No
specific injury.

EXAM:
MRI OF THE RIGHT KNEE WITHOUT CONTRAST
TECHNIQUE: Multiplanar, multisequence MR imaging of the knee was performed. No
intravenous contrast was administered.

[Series 3: T2 fat-sat · axial · 4.0mm · 0.53mm/px · z∈[-115,+55]mm · 6 of 35 slices shown (1 of 3)]
[im 1/35]
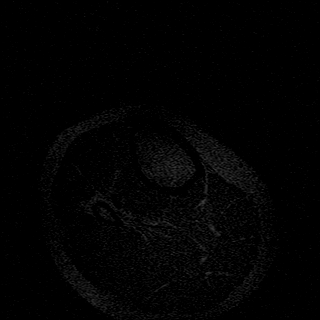
[im 7/35]
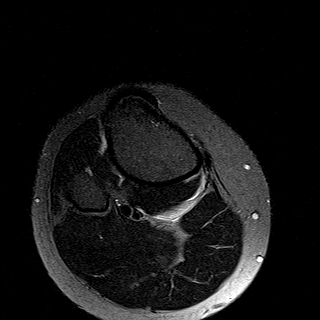
[im 14/35]
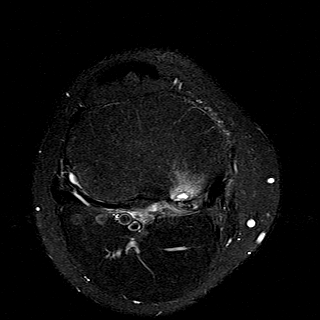
[im 21/35]
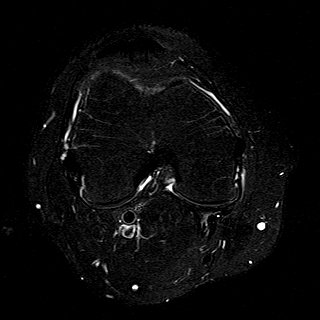
[im 28/35]
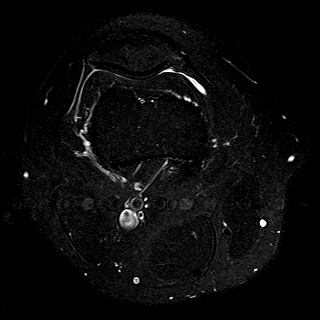
[im 35/35]
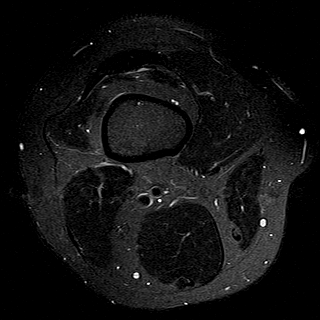

[Series 4: T1 · coronal · 4.0mm · 0.62mm/px · 6 of 31 slices shown]
[im 1/31]
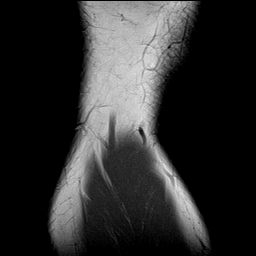
[im 7/31]
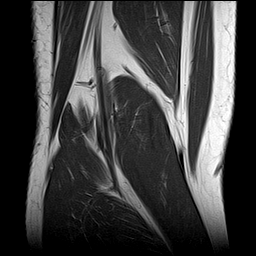
[im 13/31]
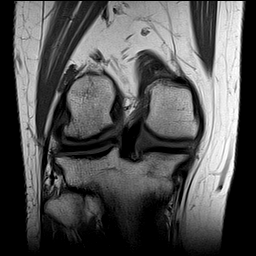
[im 19/31]
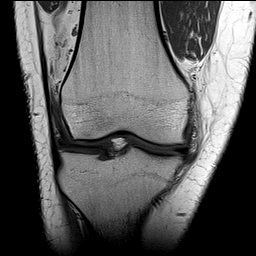
[im 25/31]
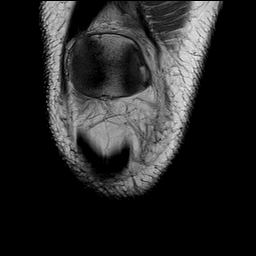
[im 31/31]
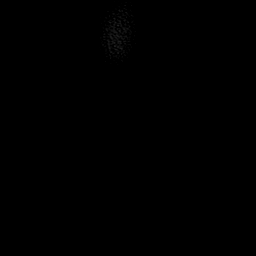

[Series 5: T2 fat-sat · coronal · 4.0mm · 0.62mm/px · 6 of 31 slices shown (2 of 3)]
[im 1/31]
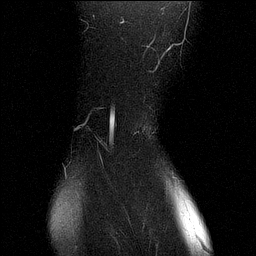
[im 7/31]
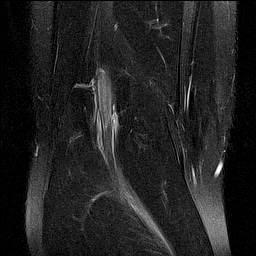
[im 13/31]
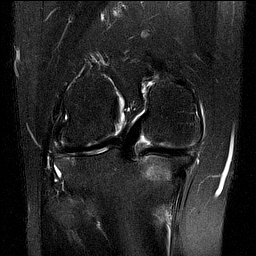
[im 19/31]
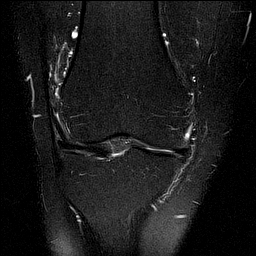
[im 25/31]
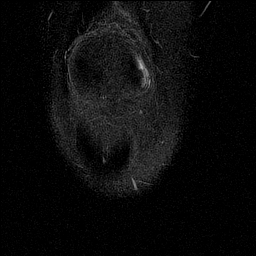
[im 31/31]
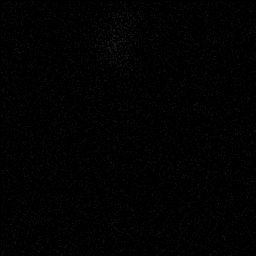

[Series 6: PD fat-sat · coronal · 4.0mm · 0.62mm/px · 6 of 31 slices shown (1 of 3)]
[im 1/31]
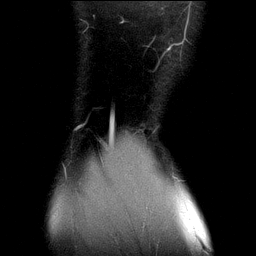
[im 7/31]
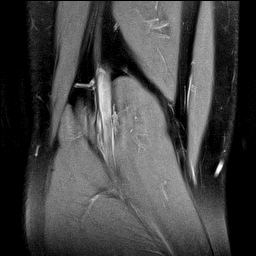
[im 13/31]
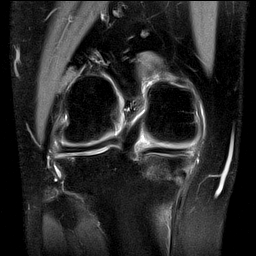
[im 19/31]
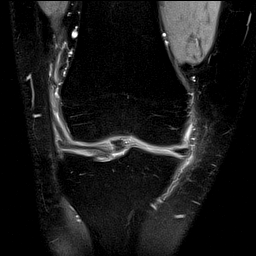
[im 25/31]
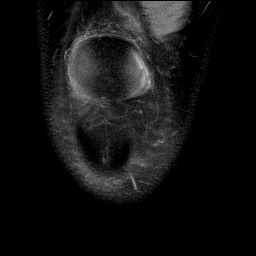
[im 31/31]
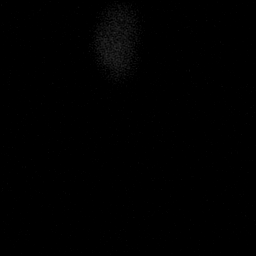

[Series 7: PD fat-sat · sagittal · 3.0mm · 0.62mm/px · 6 of 34 slices shown (2 of 3)]
[im 1/34]
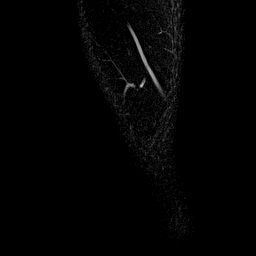
[im 7/34]
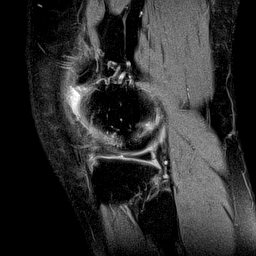
[im 14/34]
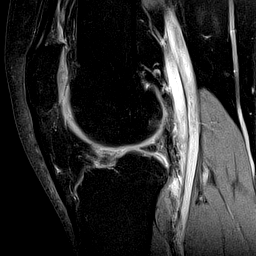
[im 20/34]
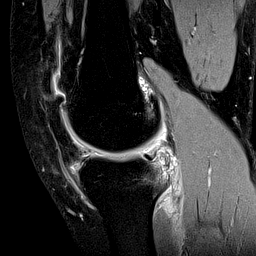
[im 27/34]
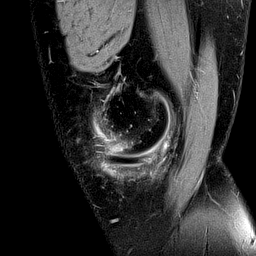
[im 34/34]
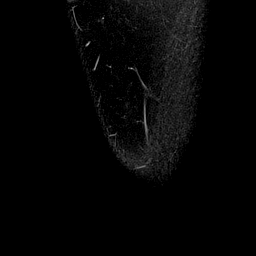

[Series 8: T2 fat-sat · sagittal · 3.0mm · 0.62mm/px · 6 of 35 slices shown (3 of 3)]
[im 1/35]
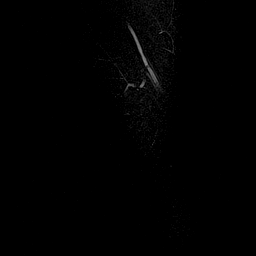
[im 7/35]
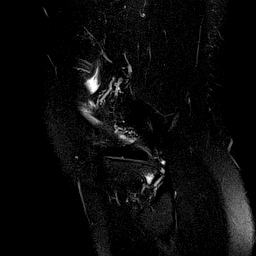
[im 14/35]
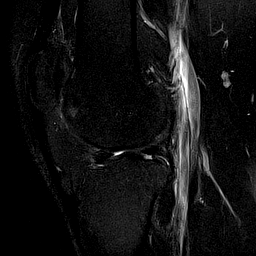
[im 21/35]
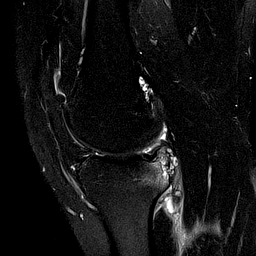
[im 28/35]
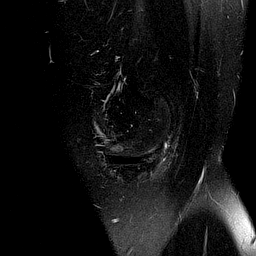
[im 35/35]
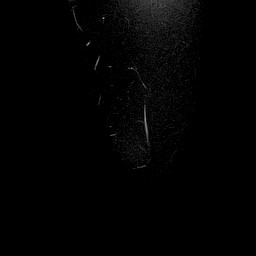

[Series 9: PD fat-sat · oblique · 2.0mm · 0.62mm/px · 4 of 19 slices shown (3 of 3)]
[im 1/19]
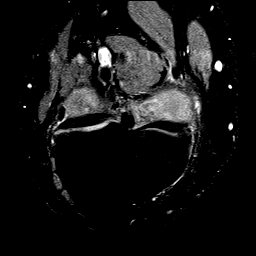
[im 7/19]
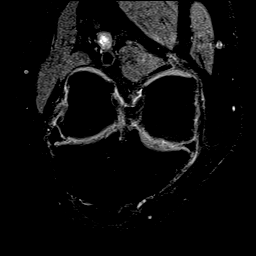
[im 13/19]
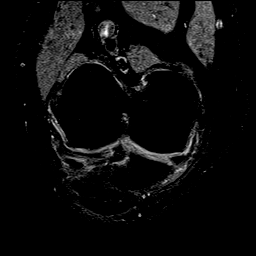
[im 19/19]
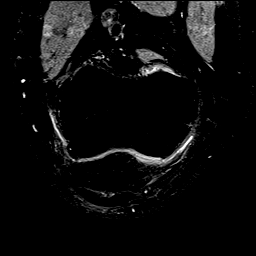

[40 of 40 positions shown; findings below may reference images not displayed]

FINDINGS: MENISCI

Medial meniscus: There is E partial-thickness radial tear involving
the posterior horn with an associated large parameniscal cyst
measuring approximately 2.9 x 2.5 cm. There is also an intraosseous
component extending into the posterior tibia with surrounding marrow
edema.

Lateral meniscus: Anterior horn and midbody tears involving the
inferior articular surface and free edge.

LIGAMENTS

Cruciates:  Intact

Collaterals:  Intact

CARTILAGE

Patellofemoral: Mild to moderate degenerative chondrosis mainly in
the femoral trochlear region.

Medial: Moderate degenerative chondrosis with areas of moderate
cartilage thinning mainly involving the femur.

Lateral: Moderate degenerative chondrosis with early joint space
narrowing and spurring.

Joint:  No joint effusion.

Popliteal Fossa:  No popliteal mass or Baker's cyst.

Extensor Mechanism: The patella retinacular structures are intact
and the quadriceps and patellar tendons are intact.

Bones:  No acute bony findings.

Other: Unremarkable knee musculature.
IMPRESSION: 1. Partial-thickness radial tear involving the posterior horn of the
medial meniscus with an associated large parameniscal cyst. There is
also an intraosseous component extending into the posterior tibia
with surrounding marrow edema.
2. Small anterior horn and midbody tears of the lateral meniscus.
3. Intact ligamentous structures and no acute bony findings.
4. Tricompartmental degenerative changes.

## 2021-04-22 ENCOUNTER — Ambulatory Visit (INDEPENDENT_AMBULATORY_CARE_PROVIDER_SITE_OTHER): Payer: BC Managed Care – PPO | Admitting: Family Medicine

## 2021-04-22 ENCOUNTER — Other Ambulatory Visit: Payer: Self-pay

## 2021-04-22 ENCOUNTER — Encounter: Payer: Self-pay | Admitting: Family Medicine

## 2021-04-22 VITALS — BP 118/70 | HR 84 | Temp 97.6°F | Ht 76.0 in | Wt 315.0 lb

## 2021-04-22 DIAGNOSIS — Z0000 Encounter for general adult medical examination without abnormal findings: Secondary | ICD-10-CM | POA: Diagnosis not present

## 2021-04-22 DIAGNOSIS — E669 Obesity, unspecified: Secondary | ICD-10-CM | POA: Diagnosis not present

## 2021-04-22 DIAGNOSIS — E6609 Other obesity due to excess calories: Secondary | ICD-10-CM | POA: Insufficient documentation

## 2021-04-22 LAB — URINALYSIS, ROUTINE W REFLEX MICROSCOPIC
Bilirubin Urine: NEGATIVE
Hgb urine dipstick: NEGATIVE
Ketones, ur: NEGATIVE
Leukocytes,Ua: NEGATIVE
Nitrite: NEGATIVE
Specific Gravity, Urine: >=1.03 — AB (ref 1.000–1.030)
Total Protein, Urine: NEGATIVE
Urine Glucose: NEGATIVE
Urobilinogen, UA: 0.2 (ref 0.0–1.0)
pH: 6 (ref 5.0–8.0)

## 2021-04-22 LAB — CBC
HCT: 42.4 % (ref 39.0–52.0)
Hemoglobin: 14 g/dL (ref 13.0–17.0)
MCHC: 32.9 g/dL (ref 30.0–36.0)
MCV: 82.1 fl (ref 78.0–100.0)
Platelets: 284 10*3/uL (ref 150.0–400.0)
RBC: 5.16 Mil/uL (ref 4.22–5.81)
RDW: 14.5 % (ref 11.5–15.5)
WBC: 5.2 10*3/uL (ref 4.0–10.5)

## 2021-04-22 LAB — COMPREHENSIVE METABOLIC PANEL
ALT: 38 U/L (ref 0–53)
AST: 28 U/L (ref 0–37)
Albumin: 4.2 g/dL (ref 3.5–5.2)
Alkaline Phosphatase: 87 U/L (ref 39–117)
BUN: 13 mg/dL (ref 6–23)
CO2: 27 mEq/L (ref 19–32)
Calcium: 9.7 mg/dL (ref 8.4–10.5)
Chloride: 104 mEq/L (ref 96–112)
Creatinine, Ser: 1.27 mg/dL (ref 0.40–1.50)
GFR: 78.12 mL/min (ref >60.00)
Glucose, Bld: 101 mg/dL — ABNORMAL HIGH (ref 70–99)
Potassium: 4.3 mEq/L (ref 3.5–5.1)
Sodium: 139 mEq/L (ref 135–145)
Total Bilirubin: 0.3 mg/dL (ref 0.2–1.2)
Total Protein: 6.9 g/dL (ref 6.0–8.3)

## 2021-04-22 LAB — LIPID PANEL
Cholesterol: 155 mg/dL (ref 0–200)
HDL: 40.6 mg/dL (ref >39.00)
LDL Cholesterol: 92 mg/dL (ref 0–99)
NonHDL: 113.9
Total CHOL/HDL Ratio: 4
Triglycerides: 112 mg/dL (ref 0.0–149.0)
VLDL: 22.4 mg/dL (ref 0.0–40.0)

## 2021-04-22 LAB — HEMOGLOBIN A1C: Hgb A1c MFr Bld: 6.3 % (ref 4.6–6.5)

## 2021-04-22 NOTE — Progress Notes (Signed)
Established Patient Office Visit  Subjective:  Patient ID: Sean [name], male    DOB: 10-Jan-1996  Age: 25 y.o. MRN: [id number]  CC:  Chief Complaint  Patient presents with   Annual Exam    CPE, no concerns patient fasting for labs.     HPI Sean [name] presents for for physical exam and advice on losing weight.  He is here fasting.  He lives with his wife and 54-month-[name] daughter.  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  He needs a physical exam for school.  He is concerned about his weight.  Just had knee surgery over the summer and is just now able to start exercising again.  Weight has been a struggle for him.  He admits frustration and sadness on this account.  History reviewed. No pertinent past medical history.  Past Surgical History:  Procedure Laterality Date   KNEE ARTHROSCOPY WITH DRILLING/MICROFRACTURE Right 11/07/2020   Procedure: KNEE ARTHROSCOPY WITH DRILLING/MICROFRACTURE;  Surgeon: Tarry Kos, MD;  Location: [facility];  Service: Orthopedics;  Laterality: Right;   KNEE ARTHROSCOPY WITH MEDIAL MENISECTOMY Right 11/07/2020   Procedure: RIGHT KNEE ARTHROSCOPY WITH PARTIAL MEDIAL MENISCECTOMY, PARTIAL LATERAL MENISCECTOMY;  Surgeon: Tarry Kos, MD;  Location: [facility];  Service: Orthopedics;  Laterality: Right;    Family History  Problem Relation Age of Onset   Diabetes Father    Diabetes Maternal Grandfather    Healthy Mother     Social History   Socioeconomic History   Marital status: Married    Spouse name: Not on file   Number of children: Not on file   Years of education: Not on file   Highest education level: Not on file  Occupational History   Not on file  Tobacco Use   Smoking status: Former    Types: Cigars   Smokeless tobacco: Never  Vaping Use   Vaping Use: Never used  Substance and Sexual Activity   Alcohol use: Not Currently   Drug use: Not Currently   Sexual  activity: Yes  Other Topics Concern   Not on file  Social History Narrative   Not on file   Social Determinants of Health   Financial Resource Strain: Not on file  Food Insecurity: Not on file  Transportation Needs: Not on file  Physical Activity: Not on file  Stress: Not on file  Social Connections: Not on file  Intimate Partner Violence: Not on file    Outpatient Medications Prior to Visit  Medication Sig Dispense Refill   HYDROcodone-acetaminophen (NORCO) 5-325 MG tablet Take 1 tablet by mouth every 6 (six) hours as needed. 20 tablet 0   Loratadine (CLARITIN PO) Take by mouth.     No facility-administered medications prior to visit.    No Known Allergies  ROS Review of Systems  Constitutional: Negative.   HENT: Negative.    Eyes:  Negative for photophobia and visual disturbance.  Respiratory: Negative.    Cardiovascular: Negative.   Gastrointestinal: Negative.   Endocrine: Negative for polyphagia.  Genitourinary: Negative.   Musculoskeletal:  Negative for arthralgias and gait problem.  Skin: Negative.   Neurological: Negative.   Psychiatric/Behavioral:  Positive for dysphoric mood. Negative for self-injury and suicidal ideas. The patient is not nervous/anxious.      Objective:    Physical Exam Vitals and nursing note reviewed.  Constitutional:      General: He is not in acute distress.  Appearance: Normal appearance. He is obese. He is not ill-appearing, toxic-appearing or diaphoretic.  HENT:     Head: Normocephalic and atraumatic.     Right Ear: Tympanic membrane, ear canal and external ear normal.     Left Ear: Tympanic membrane, ear canal and external ear normal.     Mouth/Throat:     Mouth: Mucous membranes are moist.     Pharynx: Oropharynx is clear. No oropharyngeal exudate or posterior oropharyngeal erythema.  Eyes:     General: No scleral icterus.       Right eye: No discharge.        Left eye: No discharge.     Extraocular Movements:  Extraocular movements intact.     Conjunctiva/sclera: Conjunctivae normal.     Pupils: Pupils are equal, round, and reactive to light.  Neck:     Vascular: No carotid bruit.  Cardiovascular:     Rate and Rhythm: Normal rate and regular rhythm.  Pulmonary:     Effort: Pulmonary effort is normal.     Breath sounds: Normal breath sounds.  Abdominal:     General: Abdomen is flat. Bowel sounds are normal.     Palpations: Abdomen is soft.     Hernia: There is no hernia in the left inguinal area or right inguinal area.  Genitourinary:    Penis: Circumcised. No hypospadias, erythema, tenderness, discharge, swelling or lesions.      Testes:        Right: Mass, tenderness or swelling not present. Right testis is descended.        Left: Mass, tenderness or swelling not present. Left testis is descended.     Epididymis:     Right: Not inflamed or enlarged.     Left: Not inflamed or enlarged.  Musculoskeletal:     Cervical back: No rigidity or tenderness.  Lymphadenopathy:     Cervical: No cervical adenopathy.     Lower Body: No right inguinal adenopathy. No left inguinal adenopathy.  Skin:    General: Skin is warm and dry.  Neurological:     Mental Status: He is alert and oriented to person, place, and time.  Psychiatric:        Mood and Affect: Mood normal.        Behavior: Behavior normal.    BP 118/70 (BP Location: Right Arm, Patient Position: Sitting, Cuff Size: Large)   Pulse 84   Temp 97.6 F (36.4 C) (Temporal)   Ht 6\' 4"  (1.93 m)   Wt (!) 315 lb (142.9 kg)   SpO2 97%   BMI 38.34 kg/m  Wt Readings from Last 3 Encounters:  04/22/21 (!) 315 lb (142.9 kg)  11/07/20 292 lb 8.8 oz (132.7 kg)  10/26/20 294 lb 3.2 oz (133.4 kg)     Health Maintenance Due  Topic Date Due   HIV Screening  Never done   Hepatitis C Screening  Never done   INFLUENZA VACCINE  04/08/2021    There are no preventive care reminders to display for this patient.  Lab Results  Component Value  Date   TSH 0.86 04/12/2019   Lab Results  Component Value Date   WBC 4.0 04/12/2019   HGB 14.0 04/12/2019   HCT 42.5 04/12/2019   MCV 84.3 04/12/2019   PLT 289.0 04/12/2019   Lab Results  Component Value Date   NA 139 04/12/2019   K 3.9 04/12/2019   CO2 29 04/12/2019   GLUCOSE 72 04/12/2019   BUN 12 04/12/2019  CREATININE 1.21 04/12/2019   BILITOT 0.3 04/12/2019   ALKPHOS 74 04/12/2019   AST 22 04/12/2019   ALT 29 04/12/2019   PROT 7.0 04/12/2019   ALBUMIN 4.4 04/12/2019   CALCIUM 9.8 04/12/2019   GFR 88.99 04/12/2019   No results found for: CHOL No results found for: HDL No results found for: LDLCALC No results found for: TRIG No results found for: CHOLHDL No results found for: WLSL3T    Assessment & Plan:   Problem List Items Addressed This Visit       Other   Obesity (BMI 35.0-39.9 without comorbidity) - Primary   Relevant Orders   Amb ref to Medical Nutrition Therapy-MNT   Other Visit Diagnoses     Healthcare maintenance       Relevant Orders   CBC   Comprehensive metabolic panel   Urinalysis, Routine w reflex microscopic   Lipid panel   PPD   Hemoglobin A1c       No orders of the defined types were placed in this encounter.   Follow-up: Return in about 6 months (around 10/23/2021).  Agrees to nutrition counseling for helpful advice.  Advised that losing weight is 80% calories and 20% exercise.  Discussed weight watchers as a possibility.  He was given information on calorie counting to lose weight.  Given information on health maintenance and disease prevention.  Hi Christy I morning local thanks for the pends area, by follow-up parallel of unknown.  Mliss Sax, MD

## 2021-04-24 ENCOUNTER — Other Ambulatory Visit: Payer: Self-pay

## 2021-04-24 ENCOUNTER — Ambulatory Visit: Payer: BC Managed Care – PPO

## 2022-01-10 ENCOUNTER — Ambulatory Visit (INDEPENDENT_AMBULATORY_CARE_PROVIDER_SITE_OTHER): Payer: BC Managed Care – PPO | Admitting: Family Medicine

## 2022-01-10 ENCOUNTER — Encounter: Payer: Self-pay | Admitting: Family Medicine

## 2022-01-10 ENCOUNTER — Ambulatory Visit (INDEPENDENT_AMBULATORY_CARE_PROVIDER_SITE_OTHER): Payer: BC Managed Care – PPO

## 2022-01-10 VITALS — BP 136/92 | HR 70 | Temp 97.8°F | Ht 76.0 in | Wt 315.6 lb

## 2022-01-10 DIAGNOSIS — S76912A Strain of unspecified muscles, fascia and tendons at thigh level, left thigh, initial encounter: Secondary | ICD-10-CM

## 2022-01-10 NOTE — Progress Notes (Signed)
? ?Established Patient Office Visit ? ?Subjective   ?Patient ID: Sean [name], male    DOB: [birth year]  Age: 26 y.o. MRN: [id number] ? ?Chief Complaint  ?Patient presents with  ? Hernia  ?  Possible hernia patient feel as if he is sitting on his testicles, symptoms started 3 days ago.   ? ? ?HPI developed pain in his right groin area after playing basketball 2 days ago.  It seems to improved but there is some lingering pain.  He was not sure if it involved his testicle.  There is been no dysuria or discharge fever or chills.  He is in a stable relationship.  Here today with his wife and-year-old daughter.  There is been no buttock pain. ? ? ? ?Review of Systems  ?Constitutional: Negative.   ?HENT: Negative.    ?Eyes:  Negative for blurred vision, discharge and redness.  ?Respiratory: Negative.    ?Cardiovascular: Negative.   ?Gastrointestinal:  Negative for abdominal pain.  ?Genitourinary: Negative.  Negative for dysuria, frequency, hematuria and urgency.  ?Musculoskeletal: Negative.  Negative for back pain, joint pain and myalgias.  ?Skin:  Negative for rash.  ?Neurological:  Negative for tingling, loss of consciousness and weakness.  ?Endo/Heme/Allergies:  Negative for polydipsia.  ? ?  ?Objective:  ?  ? ?BP (!) 136/92 (BP Location: Left Arm, Patient Position: Sitting, Cuff Size: Large)   Pulse 70   Temp 97.8 ?F (36.6 ?C) (Temporal)   Ht 6\' 4"  (1.93 m)   Wt (!) 315 lb 9.6 oz (143.2 kg)   SpO2 95%   BMI 38.42 kg/m?  ?Wt Readings from Last 3 Encounters:  ?01/10/22 (!) 315 lb 9.6 oz (143.2 kg)  ?04/22/21 (!) 315 lb (142.9 kg)  ?11/07/20 292 lb 8.8 oz (132.7 kg)  ? ?  ? ?Physical Exam ?Constitutional:   ?   General: He is not in acute distress. ?   Appearance: Normal appearance. He is not ill-appearing, toxic-appearing or diaphoretic.  ?HENT:  ?   Head: Normocephalic and atraumatic.  ?   Right Ear: External ear normal.  ?   Left Ear: External ear normal.  ?Eyes:  ?   General: No scleral icterus.    ?   Right  eye: No discharge.     ?   Left eye: No discharge.  ?   Extraocular Movements: Extraocular movements intact.  ?   Conjunctiva/sclera: Conjunctivae normal.  ?Pulmonary:  ?   Effort: Pulmonary effort is normal. No respiratory distress.  ?Abdominal:  ?   General: Bowel sounds are normal.  ?   Tenderness: There is no abdominal tenderness. There is no guarding.  ?   Hernia: There is no hernia in the left inguinal area or right inguinal area.  ?Genitourinary: ?   Penis: Circumcised. No hypospadias, erythema, tenderness, discharge, swelling or lesions.   ?   Testes:     ?   Right: Mass, tenderness or swelling not present. Right testis is descended.     ?   Left: Mass, tenderness or swelling not present. Left testis is descended.  ?   Epididymis:  ?   Right: Not inflamed or enlarged.  ?   Left: Not inflamed or enlarged.  ?Musculoskeletal:  ?   Right hip: No tenderness or bony tenderness. Decreased range of motion. Normal strength.  ?   Left hip: No tenderness or bony tenderness. Normal range of motion. Normal strength.  ?     Legs: ? ?Lymphadenopathy:  ?  Lower Body: No right inguinal adenopathy. No left inguinal adenopathy.  ?Skin: ?   General: Skin is warm and dry.  ?Neurological:  ?   Mental Status: He is alert and oriented to person, place, and time.  ?Psychiatric:     ?   Mood and Affect: Mood normal.     ?   Behavior: Behavior normal.  ? ? ? ?No results found for any visits on 01/10/22. ? ? ? ?The ASCVD Risk score (Arnett DK, et al., 2019) failed to calculate for the following reasons: ?  The 2019 ASCVD risk score is only valid for ages 55 to 37 ? ?  ?Assessment & Plan:  ? ?Problem List Items Addressed This Visit   ?None ?Visit Diagnoses   ? ? Muscle strain of left thigh, initial encounter    -  Primary  ? Relevant Orders  ? DG Hip Unilat W OR W/O Pelvis 2-3 Views Right  ? ?  ? ? ?Return if symptoms worsen or fail to improve.  ? ?Continue rest.  Anti-inflammatories as needed. ?Mliss Sax, MD ? ?

## 2022-05-06 IMAGING — DX DG HIP (WITH OR WITHOUT PELVIS) 2-3V*R*
2 series · 2 of 2 positions shown · non-contrast
Comparison: None Available.

CLINICAL DATA: Decreased range of motion.

EXAM:
DG HIP (WITH OR WITHOUT PELVIS) 2-3V RIGHT

[pelvis ap]
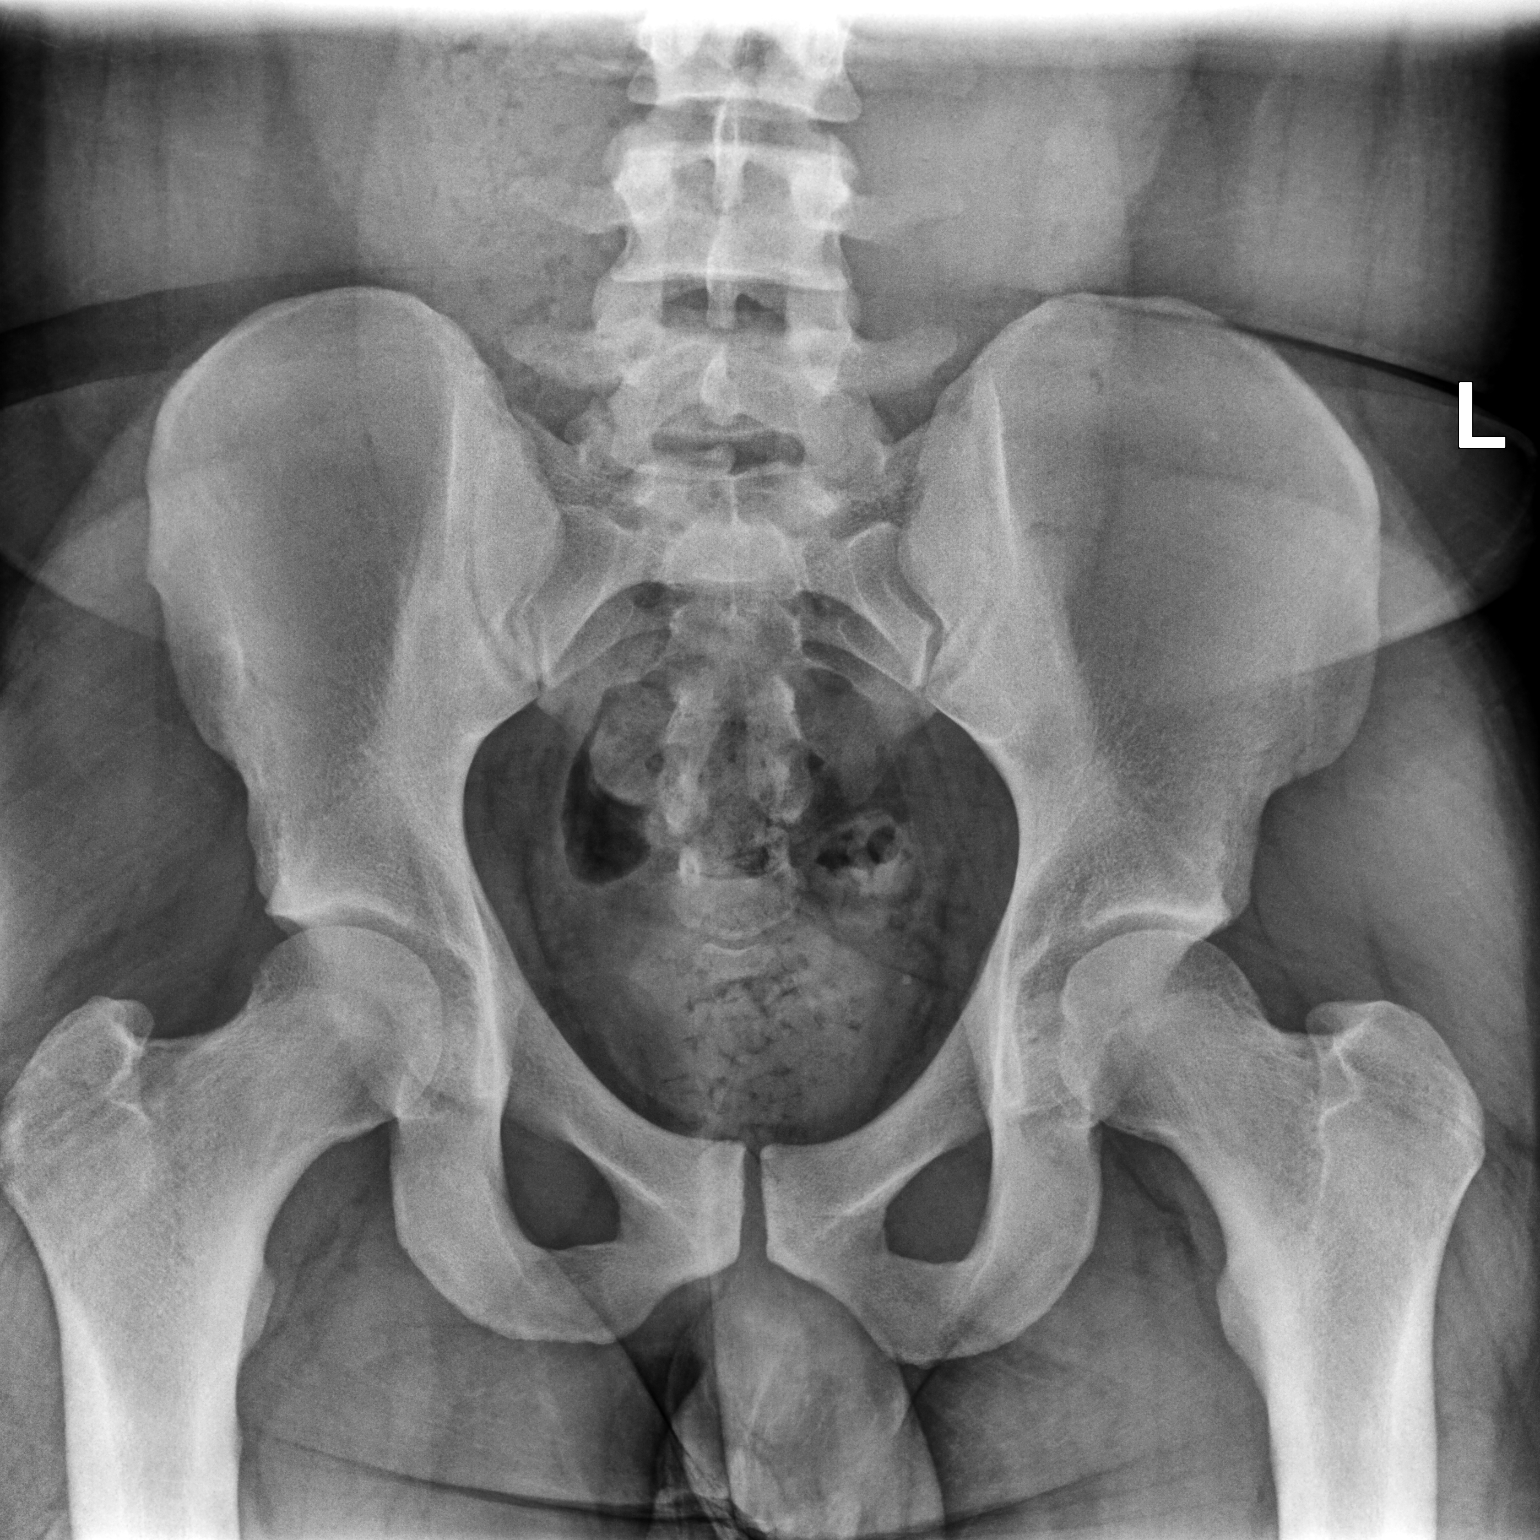

[hip frog leg lat]
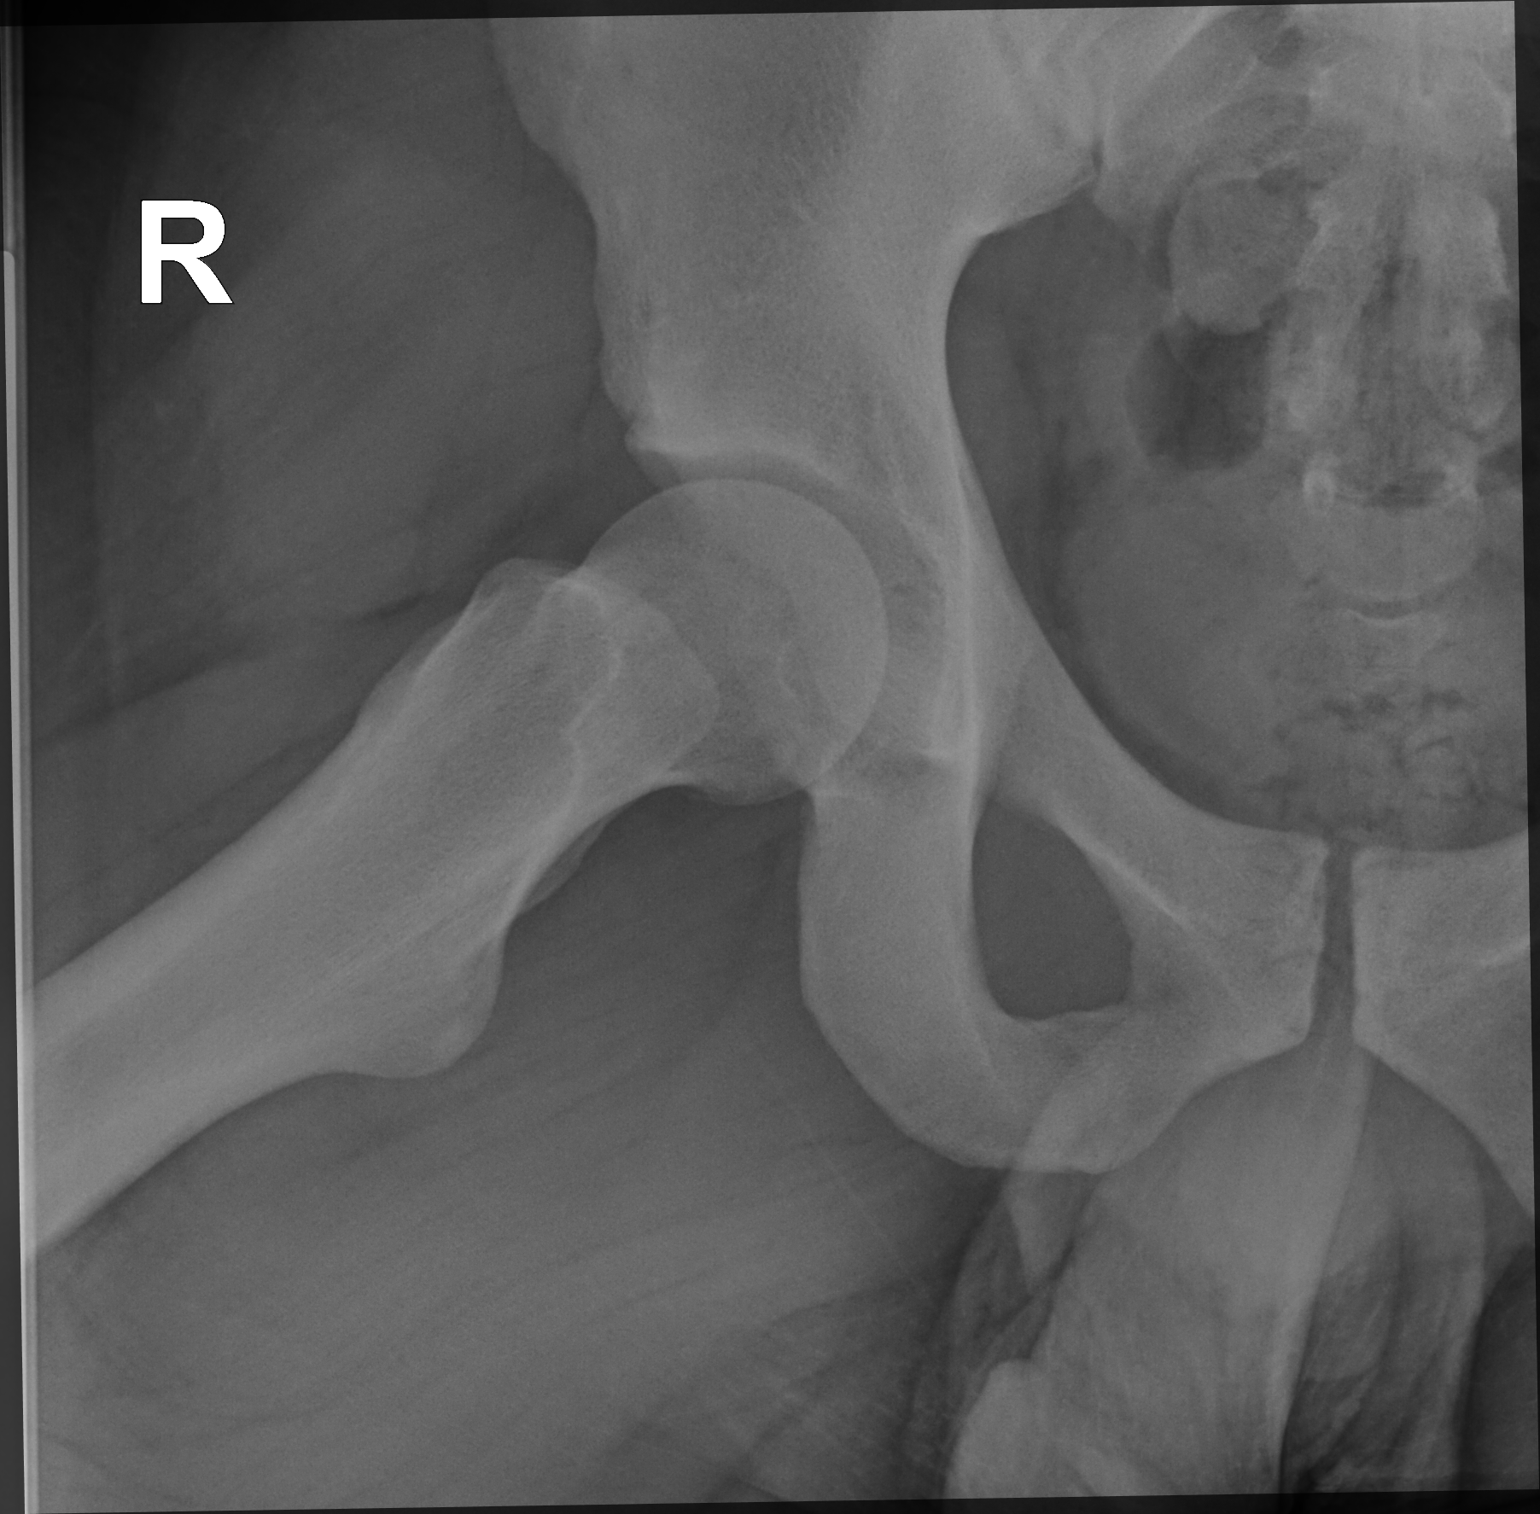

[2 of 2 positions shown; findings below may reference images not displayed]

FINDINGS: There is no evidence of hip fracture or dislocation. There is no
evidence of arthropathy or other focal bone abnormality.
IMPRESSION: Negative.

## 2022-09-05 ENCOUNTER — Encounter: Payer: Self-pay | Admitting: Family Medicine

## 2022-09-05 ENCOUNTER — Telehealth: Payer: Self-pay | Admitting: Family Medicine

## 2022-09-05 ENCOUNTER — Ambulatory Visit: Payer: BC Managed Care – PPO | Admitting: Family Medicine

## 2022-09-05 VITALS — BP 120/82 | HR 94 | Temp 97.9°F | Ht 76.0 in | Wt 315.0 lb

## 2022-09-05 DIAGNOSIS — J069 Acute upper respiratory infection, unspecified: Secondary | ICD-10-CM

## 2022-09-05 DIAGNOSIS — Z23 Encounter for immunization: Secondary | ICD-10-CM

## 2022-09-05 NOTE — Progress Notes (Signed)
  [facility] [address] [place] [address] Dept: [phone] Dept Fax: [phone]  Office Visit  Subjective:    Patient ID: Sean [name], male    DOB: 11-01-1995, 26 y.o..   MRN: [id number]  Chief Complaint  Patient presents with   URI   History of Present Illness:  Patient is in today complaining of a 3-week history of head and chest congestion, rhinorrhea, and cough. He is a Librarian, academic. He notes he was developing symptoms before school let out. His daughter has since become sick. School is starting back next week and he is looking for ways to clear his cough. he has been using Mucinex.  Past Medical History: Patient Active Problem List   Diagnosis Date Noted   Obesity (BMI 35.0-39.9 without comorbidity) 04/22/2021   Acute tear lateral meniscus, right, initial encounter 11/07/2020   Chondromalacia, right knee 11/07/2020   Acute medial meniscus tear of right knee 10/30/2020   Right knee pain 05/03/2020   Other fatigue 04/12/2019   Past Surgical History:  Procedure Laterality Date   KNEE ARTHROSCOPY WITH DRILLING/MICROFRACTURE Right 11/07/2020   Procedure: KNEE ARTHROSCOPY WITH DRILLING/MICROFRACTURE;  Surgeon: Tarry Kos, MD;  Location: [facility];  Service: Orthopedics;  Laterality: Right;   KNEE ARTHROSCOPY WITH MEDIAL MENISECTOMY Right 11/07/2020   Procedure: RIGHT KNEE ARTHROSCOPY WITH PARTIAL MEDIAL MENISCECTOMY, PARTIAL LATERAL MENISCECTOMY;  Surgeon: Tarry Kos, MD;  Location: [facility];  Service: Orthopedics;  Laterality: Right;   Family History  Problem Relation Age of Onset   Diabetes Father    Diabetes Maternal Grandfather    Healthy Mother    No outpatient medications prior to visit.   No facility-administered medications prior to visit.   No Known Allergies    Objective:   Today's Vitals   09/05/22 1558  BP: 120/82  Pulse: 94  Temp: 97.9 F  (36.6 C)  TempSrc: Temporal  SpO2: 97%  Weight: (!) 315 lb (142.9 kg)  Height: 6\' 4"  (1.93 m)   Body mass index is 38.34 kg/m.   General: Well developed, well nourished. No acute distress. HEENT: Normocephalic, non-traumatic. PERRL, EOMI. Conjunctiva clear. External ears normal. EAC and   TMs normal bilaterally. Nose clear without congestion or rhinorrhea. Mucous membranes moist.   Oropharynx clear. Good dentition. Neck: Supple. No lymphadenopathy. No thyromegaly. Lungs: Clear to auscultation bilaterally. No wheezing, rales or rhonchi. CV: RRR without murmurs or rubs. Pulses 2+ bilaterally. Psych: Alert and oriented. Normal mood and affect.  Health Maintenance Due  Topic Date Due   HIV Screening  Never done   Hepatitis C Screening  Never done   INFLUENZA VACCINE  04/08/2022     Assessment & Plan:   1. Viral URI with cough I suspect he has some lingering cough form bronchitis. Discussed home care for viral illness, including rest, pushing fluids, nasal saline washes and OTC medications as needed for symptom relief. Recommend hot tea with honey for sore throat symptoms. Follow-up if needed for worsening or persistent symptoms.  2. Need for influenza vaccination  - Flu Vaccine QUAD 6+ mos PF IM (Fluarix Quad PF)   Return if symptoms worsen or fail to improve.   06/08/2022, MD

## 2022-09-05 NOTE — Telephone Encounter (Signed)
Caller Name: Osher Oettinger Call back phone #: [phone]  Reason for Call: Pt had an acute with Dr. Veto Kemps on 12/29 and is now wanting to transfer his care to him. PCP was originally with Dr. Doreene Burke. Is this okay to schedule?

## 2022-09-12 ENCOUNTER — Ambulatory Visit: Payer: BC Managed Care – PPO | Admitting: Family Medicine

## 2022-09-12 ENCOUNTER — Encounter: Payer: Self-pay | Admitting: Family Medicine

## 2022-09-12 ENCOUNTER — Ambulatory Visit (INDEPENDENT_AMBULATORY_CARE_PROVIDER_SITE_OTHER): Payer: BC Managed Care – PPO | Admitting: Family Medicine

## 2022-09-12 VITALS — BP 132/78 | HR 68 | Temp 97.4°F | Ht 76.0 in | Wt 311.6 lb

## 2022-09-12 DIAGNOSIS — J069 Acute upper respiratory infection, unspecified: Secondary | ICD-10-CM | POA: Diagnosis not present

## 2022-09-12 NOTE — Patient Instructions (Signed)
Use Afrin (oxymetazoline), 1 spray each nostril if needed for ear pain due to cabin pressure changes.

## 2022-09-12 NOTE — Progress Notes (Signed)
  [facility] [address] [place] [address] Dept: [phone] Dept Fax: [phone]  Office Visit  Subjective:    Patient ID: Sean [name], male    DOB: 1995/11/07, 27 y.o..   MRN: [id number]  Chief Complaint  Patient presents with   Acute Visit    C/o having RT x 2 days.      History of Present Illness:  Patient is in today for assessment of his ears. I had seen him on 12/29 with a viral URI. He notes this had improved, but in the past few days, he started having some ear issue.s He and his wife are leaving tomorrow to fly to [facility]. He wa worried about how he might do on the trip and wanted to be reassessed.  Past Medical History: Patient Active Problem List   Diagnosis Date Noted   Obesity (BMI 35.0-39.9 without comorbidity) 04/22/2021   Acute tear lateral meniscus, right, initial encounter 11/07/2020   Chondromalacia, right knee 11/07/2020   Acute medial meniscus tear of right knee 10/30/2020   Right knee pain 05/03/2020   Other fatigue 04/12/2019   Past Surgical History:  Procedure Laterality Date   KNEE ARTHROSCOPY WITH DRILLING/MICROFRACTURE Right 11/07/2020   Procedure: KNEE ARTHROSCOPY WITH DRILLING/MICROFRACTURE;  Surgeon: Leandrew Koyanagi, MD;  Location: [facility];  Service: Orthopedics;  Laterality: Right;   KNEE ARTHROSCOPY WITH MEDIAL MENISECTOMY Right 11/07/2020   Procedure: RIGHT KNEE ARTHROSCOPY WITH PARTIAL MEDIAL MENISCECTOMY, PARTIAL LATERAL MENISCECTOMY;  Surgeon: Leandrew Koyanagi, MD;  Location: [facility];  Service: Orthopedics;  Laterality: Right;   Family History  Problem Relation Age of Onset   Diabetes Father    Diabetes Maternal Grandfather    Healthy Mother    No outpatient medications prior to visit.   No facility-administered medications prior to visit.    No Known Allergies    Objective:   Today's Vitals   09/12/22 1348  BP: 132/78  Pulse: 68  Temp:  (!) 97.4 F (36.3 C)  TempSrc: Temporal  SpO2: 98%  Weight: (!) 311 lb 9.6 oz (141.3 kg)  Height: 6\' 4"  (1.93 m)   Body mass index is 37.93 kg/m.   General: Well developed, well nourished. No acute distress. HEENT: Normocephalic, non-traumatic. Conjunctiva clear. External ears normal. EAC and TMs normal   bilaterally. Nose clear without congestion or rhinorrhea. Mucous membranes moist. Oropharynx clear.   Good dentition. Psych: Alert and oriented. Normal mood and affect.  Health Maintenance Due  Topic Date Due   HIV Screening  Never done   Hepatitis C Screening  Never done     Assessment & Plan:   1. Upper respiratory tract infection, unspecified type Mr. Ates appears to have some mild ear discomfort as a residual of his recent URI. I do not see signs of an active infection. I did recommend that he bring Afrin with him on the plane. He should use Afrin (oxymetazoline), 1 spray each nostril if needed for ear pain due to cabin pressure changes.   Return if symptoms worsen or fail to improve.   Haydee Salter, MD

## 2022-09-22 ENCOUNTER — Ambulatory Visit: Payer: BC Managed Care – PPO | Admitting: Family Medicine

## 2022-09-22 ENCOUNTER — Encounter: Payer: Self-pay | Admitting: Family Medicine

## 2022-09-22 VITALS — BP 122/84 | HR 90 | Temp 97.0°F | Ht 76.0 in | Wt 309.6 lb

## 2022-09-22 DIAGNOSIS — E669 Obesity, unspecified: Secondary | ICD-10-CM

## 2022-09-22 DIAGNOSIS — R7303 Prediabetes: Secondary | ICD-10-CM

## 2022-09-22 DIAGNOSIS — Z1159 Encounter for screening for other viral diseases: Secondary | ICD-10-CM | POA: Diagnosis not present

## 2022-09-22 LAB — BASIC METABOLIC PANEL
BUN: 11 mg/dL (ref 6–23)
CO2: 27 mEq/L (ref 19–32)
Calcium: 9.7 mg/dL (ref 8.4–10.5)
Chloride: 104 mEq/L (ref 96–112)
Creatinine, Ser: 1.24 mg/dL (ref 0.40–1.50)
GFR: 79.6 mL/min (ref >60.00)
Glucose, Bld: 101 mg/dL — ABNORMAL HIGH (ref 70–99)
Potassium: 4.2 mEq/L (ref 3.5–5.1)
Sodium: 138 mEq/L (ref 135–145)

## 2022-09-22 LAB — HEMOGLOBIN A1C: Hgb A1c MFr Bld: 6.3 % (ref 4.6–6.5)

## 2022-09-22 NOTE — Progress Notes (Signed)
[facility] [address] [place] [address] Dept: [phone] Dept Fax: [phone]  Transfer of Care Office Visit  Subjective:    Patient ID: Sean [name], male    DOB: 09-Nov-1995, 27 y.o..   MRN: [id number]  Chief Complaint  Patient presents with   [place] care.  No concerns.  Fasting today.      History of Present Illness:  Patient is in today to establish care. Sean [name] was born in [place], [place]. Around age 50, his family moved to [place], [place]. He attended college at [employer], [place] in history, with a minor in sociology. 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 He has been married Veterinary surgeon) for 3 years. He has a daughter Sean [name]) who is 88 months old. He denies use of tobacco (rare cigar) and drugs. He drinks alcohol about once a month.  [name] has a history of prediabetes and obesity. He has a strong family history of diabetes. He plays basketball about once a week, though not as much recently. 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 He is only making it there about once a month at this point.  Past Medical History: Patient Active Problem List   Diagnosis Date Noted   Prediabetes 09/22/2022   Obesity (BMI 35.0-39.9 without comorbidity) 04/22/2021   Chondromalacia, right knee 11/07/2020   Allergic rhinitis 01/28/2017   Past Surgical History:  Procedure Laterality Date   KNEE ARTHROSCOPY WITH DRILLING/MICROFRACTURE Right 11/07/2020   Procedure: KNEE ARTHROSCOPY WITH DRILLING/MICROFRACTURE;  Surgeon: Leandrew Koyanagi, MD;  Location: [facility];  Service: Orthopedics;  Laterality: Right;   KNEE ARTHROSCOPY WITH MEDIAL MENISECTOMY Right 11/07/2020   Procedure: RIGHT KNEE ARTHROSCOPY WITH PARTIAL MEDIAL MENISCECTOMY, PARTIAL LATERAL MENISCECTOMY;  Surgeon: Leandrew Koyanagi, MD;  Location: [facility];  Service: Orthopedics;  Laterality:  Right;   Family History  Problem Relation Age of Onset   Obesity Mother    Healthy Mother    Diabetes Father    Diabetes Paternal Uncle    Heart disease Maternal Grandmother    Stroke Maternal Grandfather    Diabetes Maternal Grandfather    No outpatient medications prior to visit.   No facility-administered medications prior to visit.   No Known Allergies    Objective:   Today's Vitals   09/22/22 0916  BP: 122/84  Pulse: 90  Temp: (!) 97 F (36.1 C)  TempSrc: Temporal  SpO2: 97%  Weight: (!) 309 lb 9.6 oz (140.4 kg)  Height: 6\' 4"  (1.93 m)   Body mass index is 37.69 kg/m.   General: Well developed, well nourished. No acute distress. Psych: Alert and oriented. Normal mood and affect.  Health Maintenance Due  Topic Date Due   HIV Screening  Never done   Hepatitis C Screening  Never done   Lab Results Last lipids Lab Results  Component Value Date   CHOL 155 04/22/2021   HDL 40.60 04/22/2021   LDLCALC 92 04/22/2021   TRIG 112.0 04/22/2021   CHOLHDL 4 04/22/2021   Last hemoglobin A1c Lab Results  Component Value Date   HGBA1C 6.3 04/22/2021     Assessment & Plan:   1. Prediabetes I will recheck the A1c today. We discussed the evidence for prevention of progression of prediabetes to diabetes showing the advantage of weight loss through exercise.   - Hemoglobin W5I - Basic metabolic panel  2. Obesity (BMI 35.0-39.9  without comorbidity) I recommend he try and get 30 minutes of moderate-intensity (or 15 min of high-intensity) exercise 5 days a week.  3. Encounter for hepatitis C screening test for low risk patient  - HCV Ab w Reflex to Quant PCR  Return in about 1 year (around 09/23/2023).   Haydee Salter, MD

## 2022-09-23 LAB — HCV AB W REFLEX TO QUANT PCR: HCV Ab: NONREACTIVE

## 2022-09-23 LAB — HCV INTERPRETATION

## 2022-10-02 ENCOUNTER — Encounter: Payer: Self-pay | Admitting: Family Medicine

## 2022-10-02 MED ORDER — WEGOVY 0.25 MG/0.5ML ~~LOC~~ SOAJ: 0.2500 mg | SUBCUTANEOUS | 0 refills | Status: DC

## 2022-10-17 MED ORDER — WEGOVY 0.25 MG/0.5ML ~~LOC~~ SOAJ: 0.2500 mg | SUBCUTANEOUS | 0 refills | Status: DC

## 2022-10-17 NOTE — Addendum Note (Signed)
Addended by: Haydee Salter on: 10/17/2022 05:55 PM   Modules accepted: Orders

## 2022-11-12 ENCOUNTER — Ambulatory Visit: Payer: BC Managed Care – PPO | Admitting: Family Medicine

## 2022-11-12 ENCOUNTER — Encounter: Payer: Self-pay | Admitting: Family Medicine

## 2022-11-12 VITALS — BP 124/84 | HR 81 | Temp 98.0°F | Ht 76.0 in | Wt 298.8 lb

## 2022-11-12 DIAGNOSIS — Z6837 Body mass index (BMI) 37.0-37.9, adult: Secondary | ICD-10-CM

## 2022-11-12 DIAGNOSIS — R7303 Prediabetes: Secondary | ICD-10-CM

## 2022-11-12 MED ORDER — WEGOVY 0.5 MG/0.5ML ~~LOC~~ SOAJ: 0.5000 mg | SUBCUTANEOUS | 0 refills | Status: AC

## 2022-11-12 MED ORDER — SEMAGLUTIDE (1 MG/DOSE) 4 MG/3ML ~~LOC~~ SOPN: 1.0000 mg | PEN_INJECTOR | SUBCUTANEOUS | 1 refills | Status: DC

## 2022-11-12 NOTE — Assessment & Plan Note (Signed)
Discussed how weight loss is the best approach to reduce progression to diabetes. I will see if we can get approval for Ozempic for managing this.

## 2022-11-12 NOTE — Progress Notes (Signed)
[facility] [address] [place] [address] Dept: [phone] Dept Fax: [phone]  Chronic Care Office Visit  Subjective:    Patient ID: Sean [name], male    DOB: 03/25/1996, 27 y.o..   MRN: [id number]  Chief Complaint  Patient presents with   Medical Management of Chronic Issues    1 month f/u weight management.  Down 10 lbs.     History of Present Illness:  Patient is in today for reassessment of chronic medical issues.  Mr. Stogsdill is currently on Wegovy 0.25 mg weekly to assist with weight loss. He notes his state insurance program is no longer covering weight loss medications. He is pleased with the results so far, but is hopeful for finding a way to get insurance to help cover this. He knows Ozempic is covered on the program. He is tolerating the medication at present. He has had some issues with constipation.   Past Medical History: Patient Active Problem List   Diagnosis Date Noted   Prediabetes 09/22/2022   Class 2 obesity due to excess calories with body mass index (BMI) of 37.0 to 37.9 in adult 04/22/2021   Chondromalacia, right knee 11/07/2020   Allergic rhinitis 01/28/2017   Past Surgical History:  Procedure Laterality Date   KNEE ARTHROSCOPY WITH DRILLING/MICROFRACTURE Right 11/07/2020   Procedure: KNEE ARTHROSCOPY WITH DRILLING/MICROFRACTURE;  Surgeon: Leandrew Koyanagi, MD;  Location: [facility];  Service: Orthopedics;  Laterality: Right;   KNEE ARTHROSCOPY WITH MEDIAL MENISECTOMY Right 11/07/2020   Procedure: RIGHT KNEE ARTHROSCOPY WITH PARTIAL MEDIAL MENISCECTOMY, PARTIAL LATERAL MENISCECTOMY;  Surgeon: Leandrew Koyanagi, MD;  Location: [facility];  Service: Orthopedics;  Laterality: Right;   Family History  Problem Relation Age of Onset   Obesity Mother    Healthy Mother    Diabetes Father    Diabetes Paternal Uncle    Heart disease Maternal Grandmother    Stroke Maternal  Grandfather    Diabetes Maternal Grandfather    Outpatient Medications Prior to Visit  Medication Sig Dispense Refill   Semaglutide-Weight Management (WEGOVY) 0.25 MG/0.5ML SOAJ Inject 0.25 mg into the skin once a week. 2 mL 0   No facility-administered medications prior to visit.   No Known Allergies Objective:   Today's Vitals   11/12/22 1521  BP: 124/84  Pulse: 81  Temp: 98 F (36.7 C)  TempSrc: Temporal  SpO2: 98%  Weight: 298 lb 12.8 oz (135.5 kg)  Height: '6\' 4"'$  (1.93 m)   Body mass index is 36.37 kg/m.   General: Well developed, well nourished. No acute distress. Psych: Alert and oriented. Normal mood and affect.  Health Maintenance Due  Topic Date Due   HIV Screening  Never done   Lab Results    Latest Ref Rng & Units 09/22/2022    9:52 AM 04/22/2021    8:30 AM 04/12/2019   10:00 AM  CMP  Glucose 70 - 99 mg/dL 101  101  72   BUN 6 - 23 mg/dL '11  13  12   '$ Creatinine 0.40 - 1.50 mg/dL 1.24  1.27  1.21   Sodium 135 - 145 mEq/L 138  139  139   Potassium 3.5 - 5.1 mEq/L 4.2  4.3  3.9   Chloride 96 - 112 mEq/L 104  104  103   CO2 19 - 32 mEq/L '27  27  29   '$ Calcium 8.4 - 10.5 mg/dL 9.7  9.7  9.8  Total Protein 6.0 - 8.3 g/dL  6.9  7.0   Total Bilirubin 0.2 - 1.2 mg/dL  0.3  0.3   Alkaline Phos 39 - 117 U/L  87  74   AST 0 - 37 U/L  28  22   ALT 0 - 53 U/L  38  29    Last lipids Lab Results  Component Value Date   CHOL 155 04/22/2021   HDL 40.60 04/22/2021   LDLCALC 92 04/22/2021   TRIG 112.0 04/22/2021   CHOLHDL 4 04/22/2021   Last hemoglobin A1c Lab Results  Component Value Date   HGBA1C 6.3 09/22/2022      Assessment & Plan:   Problem List Items Addressed This Visit       Other   Class 2 obesity due to excess calories with body mass index (BMI) of 37.0 to 37.9 in adult - Primary    Maximum weight: 315 lbs (09/05/2022) Current weight: 298 lbs Weight change since last visit: -11 lbs Total weight loss: 17 lbs (5.4%)  Mr. Moley is doing  good with his commitment to exercise. I will step his dose of Wegovy up to 0.5 mg weekly. I will try submitting Ozempic under a diagnosis of prediabetes to see if we can get this covered.      Relevant Medications   Semaglutide-Weight Management (WEGOVY) 0.5 MG/0.5ML SOAJ   Semaglutide, 1 MG/DOSE, 4 MG/3ML SOPN   Prediabetes    Discussed how weight loss is the best approach to reduce progression to diabetes. I will see if we can get approval for Ozempic for managing this.      Relevant Medications   Semaglutide, 1 MG/DOSE, 4 MG/3ML SOPN    Return in about 6 weeks (around 12/24/2022) for Reassessment.   Haydee Salter, MD

## 2022-11-12 NOTE — Assessment & Plan Note (Signed)
Maximum weight: 315 lbs (09/05/2022) Current weight: 298 lbs Weight change since last visit: -11 lbs Total weight loss: 17 lbs (5.4%)  Sean [name] is doing good with his commitment to exercise. I will step his dose of Wegovy up to 0.5 mg weekly. I will try submitting Ozempic under a diagnosis of prediabetes to see if we can get this covered.

## 2022-11-24 ENCOUNTER — Encounter: Payer: Self-pay | Admitting: Family Medicine

## 2022-12-08 ENCOUNTER — Telehealth: Payer: Self-pay

## 2022-12-08 NOTE — Telephone Encounter (Signed)
Pharmacy Patient Advocate Encounter  Received notification from [facility] that the request for prior authorization for Ozempic has been denied due to not meeting requirements.       Please be advised we currently do not have a Pharmacist to review denials, therefore you will need to process appeals accordingly as needed. Thanks for your support at this time.   You may call [phone] or fax [phone], to appeal.

## 2022-12-24 ENCOUNTER — Ambulatory Visit: Payer: BC Managed Care – PPO | Admitting: Family Medicine

## 2023-05-12 ENCOUNTER — Encounter: Payer: Self-pay | Admitting: Family Medicine

## 2023-05-12 ENCOUNTER — Ambulatory Visit: Payer: BC Managed Care – PPO | Admitting: Family Medicine

## 2023-05-12 DIAGNOSIS — S80812A Abrasion, left lower leg, initial encounter: Secondary | ICD-10-CM | POA: Diagnosis not present

## 2023-05-12 DIAGNOSIS — M542 Cervicalgia: Secondary | ICD-10-CM | POA: Diagnosis not present

## 2023-05-12 DIAGNOSIS — M545 Low back pain, unspecified: Secondary | ICD-10-CM | POA: Diagnosis not present

## 2023-05-12 MED ORDER — NAPROXEN 500 MG PO TABS: 500.0000 mg | ORAL_TABLET | Freq: Two times a day (BID) | ORAL | 0 refills | Status: DC

## 2023-05-12 MED ORDER — CYCLOBENZAPRINE HCL 10 MG PO TABS: 10.0000 mg | ORAL_TABLET | Freq: Three times a day (TID) | ORAL | 0 refills | Status: DC | PRN
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
## 2023-07-06 ENCOUNTER — Encounter: Payer: Self-pay | Admitting: Nurse Practitioner

## 2023-07-06 ENCOUNTER — Telehealth: Payer: Self-pay | Admitting: Family Medicine

## 2023-07-06 ENCOUNTER — Ambulatory Visit: Payer: BC Managed Care – PPO | Admitting: Nurse Practitioner

## 2023-07-06 VITALS — BP 134/84 | HR 75 | Temp 97.6°F | Ht 76.0 in | Wt 281.0 lb

## 2023-07-06 DIAGNOSIS — L03012 Cellulitis of left finger: Secondary | ICD-10-CM

## 2023-07-06 MED ORDER — CEPHALEXIN 500 MG PO CAPS: 500.0000 mg | ORAL_CAPSULE | Freq: Three times a day (TID) | ORAL | 0 refills | Status: DC

## 2023-07-06 NOTE — Progress Notes (Signed)
Acute Office Visit  Subjective:     Patient ID: Sean [name], male    DOB: 1995/09/26, 27 y.o.   MRN: [id number]  Chief Complaint  Patient presents with   Nail Infection    Left middle finger infection    HPI Patient is in today for left middle finger nail infection.  Discussed the use of AI scribe software for clinical note transcription with the patient, who gave verbal consent to proceed.  History of Present Illness   The patient, with a history of a hangnail infection, presents with persistent symptoms despite initial improvement. The infection started approximately two weeks ago and was initially thought to be a regular hangnail. The patient's wife, a Publishing rights manager, attempted to drain the infection at home, which provided temporary relief. However, the infection worsened over the weekend, with the finger becoming swollen and hot to touch. The patient has been soaking the finger in Epsom salt and using an antibacterial band, but these interventions have not provided significant relief. The patient denies any systemic symptoms such as fever.      ROS See pertinent positives and negatives per HPI.     Objective:    BP 134/84 (BP Location: Left Arm)   Pulse 75   Temp 97.6 F (36.4 C)   Ht 6\' 4"  (1.93 m)   Wt 281 lb (127.5 kg)   SpO2 98%   BMI 34.20 kg/m    Physical Exam Vitals and nursing note reviewed.  Constitutional:      Appearance: Normal appearance.  HENT:     Head: Normocephalic.  Eyes:     Conjunctiva/sclera: Conjunctivae normal.  Pulmonary:     Effort: Pulmonary effort is normal.  Musculoskeletal:     Cervical back: Normal range of motion.  Skin:    General: Skin is warm.     Comments: Swelling and warmth around lateral aspect of left middle finger nail  Neurological:     General: No focal deficit present.     Mental Status: He is alert and oriented to person, place, and time.  Psychiatric:        Mood and Affect: Mood normal.         Behavior: Behavior normal.        Thought Content: Thought content normal.        Judgment: Judgment normal.       Assessment & Plan:   Problem List Items Addressed This Visit   None Visit Diagnoses     Paronychia of finger of left hand    -  Primary   Relevant Medications   cephALEXin (KEFLEX) 500 MG capsule          Paronychia They have an infection of the finger secondary to a hangnail. Initial lancing and drainage led to improvement, but symptoms have recurred without systemic symptoms. We will start Keflex 500mg  three times a day for 10 days. They should continue soaking the finger in warm Epsom salts twice a day and notify the provider if there is no improvement or if symptoms worsen.       Meds ordered this encounter  Medications   DISCONTD: cephALEXin (KEFLEX) 500 MG capsule    Sig: Take 1 capsule (500 mg total) by mouth 3 (three) times daily.    Dispense:  30 capsule    Refill:  0   cephALEXin (KEFLEX) 500 MG capsule    Sig: Take 1 capsule (500 mg total) by mouth 3 (three) times daily.  Dispense:  30 capsule    Refill:  0    Return if symptoms worsen or fail to improve.  Gerre Scull, NP

## 2023-07-06 NOTE — Addendum Note (Signed)
Addended by: Rodman Pickle A on: 07/06/2023 01:16 PM   Modules accepted: Orders

## 2023-07-06 NOTE — Telephone Encounter (Signed)
Lvm to return call

## 2023-07-06 NOTE — Patient Instructions (Addendum)
It was great to see you!  Start keflex 1 capsule 3 times a day for 10 days  Keep soaking your finger in warm epsom salts twice a day.   Let's follow-up if your symptoms worsen or don't improve.   Take care,  Rodman Pickle, NP

## 2023-07-06 NOTE — Telephone Encounter (Signed)
Please call pt, he is having difficulty getting his script cephALEXin (KEFLEX) 500 MG capsule [[id number]] from [id number] [place], [place] [address] [address]: [facility] corner of [address] Phone : [phone] I see it was sent there

## 2023-07-06 NOTE — Telephone Encounter (Signed)
This is suppose to go to med center [facility].

## 2023-07-07 ENCOUNTER — Ambulatory Visit: Payer: BC Managed Care – PPO | Admitting: Family Medicine

## 2023-07-07 NOTE — Telephone Encounter (Signed)
Patient picked up Rx at pharmacy it was called into. Dm/cma

## 2023-08-18 ENCOUNTER — Ambulatory Visit: Payer: BC Managed Care – PPO | Admitting: Internal Medicine

## 2023-08-18 VITALS — BP 106/70 | HR 86 | Temp 98.6°F | Ht 76.0 in | Wt 288.0 lb

## 2023-08-18 DIAGNOSIS — H1031 Unspecified acute conjunctivitis, right eye: Secondary | ICD-10-CM

## 2023-08-18 MED ORDER — ERYTHROMYCIN 5 MG/GM OP OINT: TOPICAL_OINTMENT | OPHTHALMIC | 0 refills | Status: DC

## 2023-08-18 NOTE — Progress Notes (Signed)
[name] [address] [place] [address] Dept: [phone] Dept Fax: [phone]  Acute Care Office Visit  Subjective:   Sean [name] 1996/02/09 08/18/2023  Chief Complaint  Patient presents with   Conjunctivitis    HPI: Discussed the use of AI scribe software for clinical note transcription with the patient, who gave verbal consent to proceed.  History of Present Illness   The patient presents with a 2-day history of redness, itching, and drainage in the right eye The symptoms began on Sunday and have progressively worsened. The patient's daughter, who attends daycare, was diagnosed with multiple viral and bacterial infections over the past several weeks, including conjunctivitis. The patient has not noticed any changes in vision.  He has been managing the symptoms with over-the-counter eye drops.         The following portions of the patient's history were reviewed and updated as appropriate: past medical history, past surgical history, family history, social history, allergies, medications, and problem list.   Patient Active Problem List   Diagnosis Date Noted   Prediabetes 09/22/2022   Class 2 obesity due to excess calories with body mass index (BMI) of 37.0 to 37.9 in adult 04/22/2021   Chondromalacia, right knee 11/07/2020   Allergic rhinitis 01/28/2017   History reviewed. No pertinent past medical history. Past Surgical History:  Procedure Laterality Date   KNEE ARTHROSCOPY WITH DRILLING/MICROFRACTURE Right 11/07/2020   Procedure: KNEE ARTHROSCOPY WITH DRILLING/MICROFRACTURE;  Surgeon: Sean Kos, MD;  Location: [facility];  Service: Orthopedics;  Laterality: Right;   KNEE ARTHROSCOPY WITH MEDIAL MENISECTOMY Right 11/07/2020   Procedure: RIGHT KNEE ARTHROSCOPY WITH PARTIAL MEDIAL MENISCECTOMY, PARTIAL LATERAL MENISCECTOMY;  Surgeon: Sean Kos, MD;  Location: [facility];  Service: Orthopedics;  Laterality: Right;   Family History  Problem Relation Age of Onset   Obesity Mother    Healthy Mother    Diabetes Father    Diabetes Paternal Uncle    Heart disease Maternal Grandmother    Stroke Maternal Grandfather    Diabetes Maternal Grandfather     Current Outpatient Medications:    erythromycin ophthalmic ointment, Instill ~1cm ribbon into affected eye(s) 4 times a day for 7 days, Disp: 3.5 g, Rfl: 0   Semaglutide-Weight Management (WEGOVY) 0.5 MG/0.5ML SOAJ, Inject 0.5 mg into the skin once a week., Disp: 2 mL, Rfl: 0 No Known Allergies   ROS: A complete ROS was performed with pertinent positives/negatives noted in the HPI. The remainder of the ROS are negative.    Objective:   Today's Vitals   08/18/23 1005  BP: 106/70  Pulse: 86  Temp: 98.6 F (37 C)  TempSrc: Temporal  SpO2: 98%  Weight: 288 lb (130.6 kg)  Height: 6\' 4"  (1.93 m)    GENERAL: Well-appearing, in NAD. Well nourished.  SKIN: Pink, warm and dry. No rash.  HEENT:    HEAD: Normocephalic, non-traumatic.  EYES: Right conjunctiva injected with exudate. PERRL, EOMI.  RESPIRATORY: Respirations even and non-labored. PSYCH/MENTAL STATUS: Alert, oriented x 3. Cooperative, appropriate mood and affect.    No results found for any visits on 08/18/23.    Assessment & Plan:  Assessment and Plan    Conjunctivitis Itchy right eye with discharge for the past two days. No changes in vision. Likely contracted from children who had confirmed conjunctivitis. -Prescribe antibiotic ointment to be applied to the eyelid four times a day x 7 days -Advise to avoid touching eyes  and to wash hands frequently.      Meds ordered this encounter  Medications   erythromycin ophthalmic ointment    Sig: Instill ~1cm ribbon into affected eye(s) 4 times a day for 7 days    Dispense:  3.5 g    Refill:  0    Order Specific Question:   Supervising Provider    Answer:   Sean [name]  [[id number]]   No orders of the defined types were placed in this encounter.  Lab Orders  No laboratory test(s) ordered today   No images are attached to the encounter or orders placed in the encounter.  Return if symptoms worsen or fail to improve.   Sean Decent, FNP

## 2023-09-28 ENCOUNTER — Encounter: Payer: Self-pay | Admitting: Family Medicine

## 2023-09-28 ENCOUNTER — Ambulatory Visit (INDEPENDENT_AMBULATORY_CARE_PROVIDER_SITE_OTHER): Payer: 59 | Admitting: Family Medicine

## 2023-09-28 VITALS — BP 120/84 | HR 97 | Temp 98.4°F | Ht 76.0 in | Wt 291.0 lb

## 2023-09-28 DIAGNOSIS — L84 Corns and callosities: Secondary | ICD-10-CM

## 2023-09-28 DIAGNOSIS — M25562 Pain in left knee: Secondary | ICD-10-CM | POA: Diagnosis not present

## 2023-09-28 DIAGNOSIS — G8929 Other chronic pain: Secondary | ICD-10-CM

## 2023-09-28 DIAGNOSIS — Z0000 Encounter for general adult medical examination without abnormal findings: Secondary | ICD-10-CM | POA: Diagnosis not present

## 2023-09-28 DIAGNOSIS — R7303 Prediabetes: Secondary | ICD-10-CM | POA: Diagnosis not present

## 2023-09-28 LAB — BASIC METABOLIC PANEL
BUN: 14 mg/dL (ref 6–23)
CO2: 28 meq/L (ref 19–32)
Calcium: 9.4 mg/dL (ref 8.4–10.5)
Chloride: 104 meq/L (ref 96–112)
Creatinine, Ser: 1.3 mg/dL (ref 0.40–1.50)
GFR: 74.67 mL/min (ref >60.00)
Glucose, Bld: 92 mg/dL (ref 70–99)
Potassium: 4.1 meq/L (ref 3.5–5.1)
Sodium: 139 meq/L (ref 135–145)

## 2023-09-28 LAB — HEMOGLOBIN A1C: Hgb A1c MFr Bld: 6.1 % (ref 4.6–6.5)

## 2023-09-28 NOTE — Progress Notes (Signed)
[facility] [address] [place] [address] Dept: [phone] Dept Fax: [phone]  Annual Physical Visit  Subjective:    Patient ID: Sean [name], male    DOB: January 05, 1996, 28 y.o..   MRN: [id number]  Chief Complaint  Patient presents with   Annual Exam    CPE/labs.  Fasting today. No concerns.    History of Present Illness:  Patient is in today for an annual physical/preventative visit.  Review of Systems  Constitutional:  Negative for chills, diaphoresis, fever, malaise/fatigue and weight loss.  HENT:  Negative for congestion, ear pain, hearing loss, sinus pain, sore throat and tinnitus.   Eyes:  Negative for blurred vision, pain, discharge and redness.  Respiratory:  Negative for cough, shortness of breath and wheezing.   Cardiovascular:  Negative for chest pain and palpitations.  Gastrointestinal:  Positive for constipation. Negative for abdominal pain, diarrhea, heartburn, nausea and vomiting.       Has had some constipation associated with taking semaglutide.  Musculoskeletal:  Positive for joint pain. Negative for back pain and myalgias.       Notes chronic, intermittent pain in the left knee. Finds this hurts with certain activities, such as playing basketball.   Skin:  Negative for itching and rash.       Has area of cracking to the sole of the left foot. He notes he has flat feet and has had calluses build up here quite a bit.  Psychiatric/Behavioral:  Negative for depression. The patient is not nervous/anxious.    Past Medical History: Patient Active Problem List   Diagnosis Date Noted   Prediabetes 09/22/2022   Class 2 obesity due to excess calories with body mass index (BMI) of 37.0 to 37.9 in adult 04/22/2021   Chondromalacia, right knee 11/07/2020   Allergic rhinitis 01/28/2017   Past Surgical History:  Procedure Laterality Date   KNEE ARTHROSCOPY WITH DRILLING/MICROFRACTURE Right 11/07/2020    Procedure: KNEE ARTHROSCOPY WITH DRILLING/MICROFRACTURE;  Surgeon: Tarry Kos, MD;  Location: [facility];  Service: Orthopedics;  Laterality: Right;   KNEE ARTHROSCOPY WITH MEDIAL MENISECTOMY Right 11/07/2020   Procedure: RIGHT KNEE ARTHROSCOPY WITH PARTIAL MEDIAL MENISCECTOMY, PARTIAL LATERAL MENISCECTOMY;  Surgeon: Tarry Kos, MD;  Location: [facility];  Service: Orthopedics;  Laterality: Right;   Family History  Problem Relation Age of Onset   Obesity Mother    Healthy Mother    Diabetes Father    Diabetes Paternal Uncle    Heart disease Maternal Grandmother    Stroke Maternal Grandfather    Diabetes Maternal Grandfather    Outpatient Medications Prior to Visit  Medication Sig Dispense Refill   Semaglutide-Weight Management (WEGOVY) 0.5 MG/0.5ML SOAJ Inject 0.5 mg into the skin once a week. 2 mL 0   erythromycin ophthalmic ointment Instill ~1cm ribbon into affected eye(s) 4 times a day for 7 days 3.5 g 0   No facility-administered medications prior to visit.   No Known Allergies Objective:   Today's Vitals   09/28/23 0857  BP: 120/84  Pulse: 97  Temp: 98.4 F (36.9 C)  TempSrc: Temporal  SpO2: 98%  Weight: 291 lb (132 kg)  Height: 6\' 4"  (1.93 m)   Body mass index is 35.42 kg/m.   General: Well developed, well nourished. No acute distress. HEENT: Normocephalic, non-traumatic. PERRL, EOMI. Conjunctiva clear. External ears normal. EAC   and TMs normal bilaterally. Nose clear without congestion or rhinorrhea. Mucous membranes moist.  Oropharynx clear. Good dentition. Neck: Supple. No lymphadenopathy. No thyromegaly. Lungs: Clear to auscultation bilaterally. No wheezing, rales or rhonchi. CV: RRR without murmurs or rubs. Pulses 2+ bilaterally. Abdomen: Soft, non-tender. Bowel sounds positive, normal pitch and frequency. No   hepatosplenomegaly. No rebound or guarding. Extremities: Full ROM, esp. of left knee. No joint swelling or  tenderness. No crepitance noted. There is   a thickened callus of the sole of the left foot. There is a deep fracture of the callus. Skin: Warm and dry. No rashes. Psych: Alert and oriented. Normal mood and affect.  Health Maintenance Due  Topic Date Due   HIV Screening  Never done   Assessment & Plan:   Problem List Items Addressed This Visit       Other   Prediabetes   I will check a blood sugar and A1c.      Relevant Orders   Hemoglobin A1c   Basic metabolic panel   Other Visit Diagnoses       Annual physical exam    -  Primary   Overall good health. I support his use of semaglutide [facility]) for weight loss. Encourage regular physcial activity.     Callus of foot       I recommend referral to podiatry for management, esp. related to the deep crack in the callus.   Relevant Orders   Ambulatory referral to Podiatry     Chronic pain of left knee       No abnormality on exam. May represent chondromalacia like he has had in the right. We will manage expectantly for now.       Return in about 1 year (around 09/27/2024) for Annual preventative care.   Loyola Mast, MD

## 2023-09-28 NOTE — Assessment & Plan Note (Signed)
I will check a blood sugar and A1c.

## 2024-06-24 ENCOUNTER — Other Ambulatory Visit (HOSPITAL_BASED_OUTPATIENT_CLINIC_OR_DEPARTMENT_OTHER): Payer: Self-pay

## 2024-06-24 MED ORDER — FLUZONE 0.5 ML IM SUSY
0.5000 mL | PREFILLED_SYRINGE | Freq: Once | INTRAMUSCULAR | 0 refills | Status: AC
  Filled 2024-06-24: qty 0.5, 1d supply, fill #0

## 2024-06-27 ENCOUNTER — Other Ambulatory Visit (HOSPITAL_BASED_OUTPATIENT_CLINIC_OR_DEPARTMENT_OTHER): Payer: Self-pay
